# Patient Record
Sex: Female | Born: 1955 | Hispanic: Yes | Marital: Married | Smoking: Former smoker
Health system: Southern US, Community
[De-identification: ages and names within clinical notes are randomized; demographics above are authoritative.]

## PROBLEM LIST (undated history)

## (undated) DIAGNOSIS — I10 Essential (primary) hypertension: Secondary | ICD-10-CM

## (undated) DIAGNOSIS — M1990 Unspecified osteoarthritis, unspecified site: Secondary | ICD-10-CM

---

## 1999-02-09 DIAGNOSIS — E039 Hypothyroidism, unspecified: Secondary | ICD-10-CM | POA: Diagnosis present

## 1999-02-09 DIAGNOSIS — F900 Attention-deficit hyperactivity disorder, predominantly inattentive type: Secondary | ICD-10-CM | POA: Insufficient documentation

## 1999-02-09 DIAGNOSIS — I10 Essential (primary) hypertension: Secondary | ICD-10-CM | POA: Diagnosis present

## 1999-02-09 DIAGNOSIS — R7303 Prediabetes: Secondary | ICD-10-CM | POA: Insufficient documentation

## 1999-02-09 DIAGNOSIS — F339 Major depressive disorder, recurrent, unspecified: Secondary | ICD-10-CM | POA: Diagnosis present

## 1999-02-09 DIAGNOSIS — M797 Fibromyalgia: Secondary | ICD-10-CM | POA: Insufficient documentation

## 1999-02-09 DIAGNOSIS — F419 Anxiety disorder, unspecified: Secondary | ICD-10-CM | POA: Insufficient documentation

## 1999-02-09 DIAGNOSIS — M069 Rheumatoid arthritis, unspecified: Secondary | ICD-10-CM | POA: Insufficient documentation

## 2010-12-28 DIAGNOSIS — N80109 Endometriosis of ovary, unspecified side, unspecified depth: Secondary | ICD-10-CM | POA: Insufficient documentation

## 2010-12-28 DIAGNOSIS — G43909 Migraine, unspecified, not intractable, without status migrainosus: Secondary | ICD-10-CM | POA: Insufficient documentation

## 2021-01-18 ENCOUNTER — Telehealth: Payer: Self-pay | Admitting: Emergency Medicine

## 2021-01-18 NOTE — Progress Notes (Signed)
The patient no-showed for appointment despite this provider sending direct link, reaching out via phone with no response and waiting for at least 10 minutes from appointment time for patient to join. They will be marked as a NS for this appointment/time.   Dyamon Sosinski, PA-C    

## 2021-01-19 ENCOUNTER — Inpatient Hospital Stay
Admission: EM | Admit: 2021-01-19 | Discharge: 2021-01-21 | DRG: 177 | Disposition: A | Discharge status: Home / Self Care | Payer: Medicare Other | Attending: Internal Medicine | Admitting: Internal Medicine

## 2021-01-19 ENCOUNTER — Emergency Department: Payer: Medicare Other

## 2021-01-19 ENCOUNTER — Other Ambulatory Visit: Payer: Self-pay

## 2021-01-19 DIAGNOSIS — Z87891 Personal history of nicotine dependence: Secondary | ICD-10-CM | POA: Diagnosis not present

## 2021-01-19 DIAGNOSIS — J9601 Acute respiratory failure with hypoxia: Secondary | ICD-10-CM | POA: Diagnosis present

## 2021-01-19 DIAGNOSIS — J9600 Acute respiratory failure, unspecified whether with hypoxia or hypercapnia: Secondary | ICD-10-CM | POA: Diagnosis present

## 2021-01-19 DIAGNOSIS — R0902 Hypoxemia: Secondary | ICD-10-CM

## 2021-01-19 DIAGNOSIS — E871 Hypo-osmolality and hyponatremia: Secondary | ICD-10-CM | POA: Diagnosis present

## 2021-01-19 DIAGNOSIS — E861 Hypovolemia: Secondary | ICD-10-CM | POA: Diagnosis present

## 2021-01-19 DIAGNOSIS — T380X5A Adverse effect of glucocorticoids and synthetic analogues, initial encounter: Secondary | ICD-10-CM | POA: Diagnosis present

## 2021-01-19 DIAGNOSIS — U071 COVID-19: Secondary | ICD-10-CM | POA: Diagnosis present

## 2021-01-19 DIAGNOSIS — R739 Hyperglycemia, unspecified: Secondary | ICD-10-CM | POA: Diagnosis present

## 2021-01-19 LAB — BASIC METABOLIC PANEL
Anion gap: 8 (ref 5–15)
BUN: 10 mg/dL (ref 8–23)
CO2: 28 mmol/L (ref 22–32)
Calcium: 9.1 mg/dL (ref 8.9–10.3)
Chloride: 91 mmol/L — ABNORMAL LOW (ref 98–111)
Creatinine, Ser: 0.6 mg/dL (ref 0.44–1.00)
GFR, Estimated: >60 mL/min (ref >60)
Glucose, Bld: 116 mg/dL — ABNORMAL HIGH (ref 70–99)
Potassium: 3.4 mmol/L — ABNORMAL LOW (ref 3.5–5.1)
Sodium: 127 mmol/L — ABNORMAL LOW (ref 135–145)

## 2021-01-19 LAB — CBC
HCT: 39.7 % (ref 36.0–46.0)
Hemoglobin: 13.8 g/dL (ref 12.0–15.0)
MCH: 30.8 pg (ref 26.0–34.0)
MCHC: 34.8 g/dL (ref 30.0–36.0)
MCV: 88.6 fL (ref 80.0–100.0)
Platelets: 195 10*3/uL (ref 150–400)
RBC: 4.48 MIL/uL (ref 3.87–5.11)
RDW: 11.2 % — ABNORMAL LOW (ref 11.5–15.5)
WBC: 6.4 10*3/uL (ref 4.0–10.5)
nRBC: 0 % (ref 0.0–0.2)

## 2021-01-19 LAB — RESP PANEL BY RT-PCR (FLU A&B, COVID) ARPGX2
Influenza A by PCR: NEGATIVE
Influenza B by PCR: NEGATIVE
SARS Coronavirus 2 by RT PCR: POSITIVE — AB

## 2021-01-19 MED ORDER — SODIUM CHLORIDE 0.9 % IV SOLN
100.0000 mg | Freq: Every day | INTRAVENOUS | Status: DC
  Administered 2021-01-20 – 2021-01-21 (×2): 100 mg via INTRAVENOUS
  Filled 2021-01-19: qty 100
  Filled 2021-01-19: qty 20
  Filled 2021-01-19: qty 100

## 2021-01-19 MED ORDER — ONDANSETRON HCL 4 MG PO TABS: 4.0000 mg | ORAL_TABLET | Freq: Four times a day (QID) | ORAL | Status: DC | PRN

## 2021-01-19 MED ORDER — METHYLPREDNISOLONE SODIUM SUCC 125 MG IJ SOLR
0.5000 mg/kg | Freq: Two times a day (BID) | INTRAMUSCULAR | Status: DC
  Administered 2021-01-20 – 2021-01-21 (×3): 45 mg via INTRAVENOUS
  Filled 2021-01-19 (×3): qty 2

## 2021-01-19 MED ORDER — SODIUM CHLORIDE 0.9 % IV SOLN: Freq: Once | INTRAVENOUS | Status: AC

## 2021-01-19 MED ORDER — GUAIFENESIN-DM 100-10 MG/5ML PO SYRP: 10.0000 mL | ORAL_SOLUTION | ORAL | Status: DC | PRN

## 2021-01-19 MED ORDER — HYDROCOD POLST-CPM POLST ER 10-8 MG/5ML PO SUER
5.0000 mL | Freq: Two times a day (BID) | ORAL | Status: DC | PRN
  Administered 2021-01-21: 5 mL via ORAL
  Filled 2021-01-19: qty 5

## 2021-01-19 MED ORDER — PREDNISONE 20 MG PO TABS: 50.0000 mg | ORAL_TABLET | Freq: Every day | ORAL | Status: DC

## 2021-01-19 MED ORDER — ENOXAPARIN SODIUM 60 MG/0.6ML IJ SOSY
0.5000 mg/kg | PREFILLED_SYRINGE | INTRAMUSCULAR | Status: DC
  Administered 2021-01-20 – 2021-01-21 (×2): 45 mg via SUBCUTANEOUS
  Filled 2021-01-19 (×2): qty 0.6

## 2021-01-19 MED ORDER — ACETAMINOPHEN 325 MG PO TABS: 650.0000 mg | ORAL_TABLET | Freq: Four times a day (QID) | ORAL | Status: DC | PRN

## 2021-01-19 MED ORDER — DEXAMETHASONE SODIUM PHOSPHATE 10 MG/ML IJ SOLN
10.0000 mg | Freq: Once | INTRAMUSCULAR | Status: AC
  Administered 2021-01-19: 10 mg via INTRAVENOUS
  Filled 2021-01-19: qty 1

## 2021-01-19 MED ORDER — PREDNISONE 50 MG PO TABS: 50.0000 mg | ORAL_TABLET | Freq: Every day | ORAL | Status: DC

## 2021-01-19 MED ORDER — SODIUM CHLORIDE 0.9 % IV SOLN
200.0000 mg | Freq: Once | INTRAVENOUS | Status: AC
  Administered 2021-01-20: 200 mg via INTRAVENOUS
  Filled 2021-01-19: qty 200

## 2021-01-19 MED ORDER — ONDANSETRON HCL 4 MG/2ML IJ SOLN: 4.0000 mg | Freq: Four times a day (QID) | INTRAMUSCULAR | Status: DC | PRN

## 2021-01-19 MED ORDER — SODIUM CHLORIDE 0.9 % IV BOLUS
500.0000 mL | Freq: Once | INTRAVENOUS | Status: AC
  Administered 2021-01-19: 500 mL via INTRAVENOUS

## 2021-01-19 MED ORDER — METHYLPREDNISOLONE SODIUM SUCC 125 MG IJ SOLR: 0.5000 mg/kg | Freq: Two times a day (BID) | INTRAMUSCULAR | Status: DC

## 2021-01-19 NOTE — Progress Notes (Signed)
PHARMACIST - PHYSICIAN COMMUNICATION  CONCERNING:  Enoxaparin (Lovenox) for DVT Prophylaxis    RECOMMENDATION: Patient was prescribed enoxaprin 40mg  q24 hours for VTE prophylaxis.   Filed Weights   01/19/21 1333  Weight: 89.4 kg (197 lb)    Body mass index is 30.85 kg/m.  Estimated Creatinine Clearance: 78.3 mL/min (by C-G formula based on SCr of 0.6 mg/dL).   Based on [facility] policy patient is candidate for enoxaparin 0.5mg /kg TBW SQ every 24 hours based on BMI being >30.  DESCRIPTION: Pharmacy has adjusted enoxaparin dose per [facility] policy.  Patient is now receiving enoxaparin 0.5 mg/kg every 24 hours    [facility], PharmD, [facility] 01/19/2021 11:32 PM

## 2021-01-19 NOTE — H&P (Signed)
History and Physical    Kelli Pruitt YCX:[id number] DOB: 07-04-1955 DOA: 01/19/2021  PCP: No primary care provider on file.   Patient coming from: home  I have personally briefly reviewed patient's relevant medical records in [facility] Link  Chief Complaint: cough and fever  HPI: Kelli Pruitt is a 65 y.o. female with medical history significant for No significant medical history, diagnosed with COVID on 12/8 who was sent by her PCP to the ED due to persistent symptoms.  She has a cough, shortness of breath diarrhea and protracted weakness.  She denies fever or chills and denies chest pain or leg pain.  ED course: T-max 100.4, respirations 23 with O2 sat 88 to 89% on room air and otherwise normal vitals Blood work with mild hyponatremia of 127, potassium 3.4  EKG, personally viewed and interpreted: Sinus at 70 with no acute ST-T wave changes  Chest x-ray nonacute  Patient started on remdesivir and Decadron.  Hospitalist consulted for admission.   Review of Systems: As per HPI otherwise all other systems on review of systems negative.   Assessment/Plan Principal Problem:   Acute respiratory failure due to COVID-19 (HCC) -Continue remdesivir - Methylprednisolone - Supplemental oxygen - Antitussives - Airborne precautions  Hyponatremia - Likely hypovolemic - One-time bolus of NS and monitor   DVT prophylaxis: Lovenox  Code Status: full code  Family Communication:  none  Disposition Plan: Back to previous home environment Consults called: none  Status:At the time of admission, it appears that the appropriate admission status for this patient is INPATIENT. This is judged to be reasonable and necessary in order to provide the required intensity of service to ensure the patient's safety given the presenting symptoms, physical exam findings, and initial radiographic and laboratory data in the context of their  Comorbid conditions.   Patient requires inpatient status due to  high intensity of service, high risk for further deterioration and high frequency of surveillance required.   I certify that at the point of admission it is my clinical judgment that the patient will require inpatient hospital care spanning beyond 2 midnights     Physical Exam: Vitals:   01/19/21 1333 01/19/21 2124 01/19/21 2200 01/19/21 2230  BP: 131/65 138/66 (!) 112/29 (!) 126/51  Pulse: 75 78 79 68  Resp: 18 19 18  (!) 23  Temp: 99.9 F (37.7 C) (!) 100.4 F (38 C)    TempSrc: Oral Oral    SpO2: 90% 98% 96% 99%  Weight: 89.4 kg     Height: 5\' 7"  (1.702 m)      Constitutional: Alert, oriented x 3 .  Conversational dyspnea HEENT:      Head: Normocephalic and atraumatic.         Eyes: PERLA, EOMI, Conjunctivae are normal. Sclera is non-icteric.       Mouth/Throat: Mucous membranes are moist.       Neck: Supple with no signs of meningismus. Cardiovascular: Regular rate and rhythm. No murmurs, gallops, or rubs. 2+ symmetrical distal pulses are present . No JVD. No  LE edema Respiratory: Respiratory effort increased with coarse breath sounds bilaterally Gastrointestinal: Soft, non tender, non distended. Positive bowel sounds.  Genitourinary: No CVA tenderness. Musculoskeletal: Nontender with normal range of motion in all extremities. No cyanosis, or erythema of extremities. Neurologic:  Face is symmetric. Moving all extremities. No gross focal neurologic deficits . Skin: Skin is warm, dry.  No rash or ulcers Psychiatric: Mood and affect are appropriate     History reviewed.  No pertinent past medical history.  History reviewed. No pertinent surgical history.   reports that she has quit smoking. Her smoking use included cigarettes. She has never used smokeless tobacco. No history on file for alcohol use and drug use.  No Known Allergies  History reviewed. No pertinent family history.    Prior to Admission medications   Not on File      Labs on Admission: I have  personally reviewed following labs and imaging studies  CBC: Recent Labs  Lab 01/19/21 1352  WBC 6.4  HGB 13.8  HCT 39.7  MCV 88.6  PLT 195   Basic Metabolic Panel: Recent Labs  Lab 01/19/21 1352  NA 127*  K 3.4*  CL 91*  CO2 28  GLUCOSE 116*  BUN 10  CREATININE 0.60  CALCIUM 9.1   GFR: Estimated Creatinine Clearance: 78.3 mL/min (by C-G formula based on SCr of 0.6 mg/dL). Liver Function Tests: No results for input(s): AST, ALT, ALKPHOS, BILITOT, PROT, ALBUMIN in the last 168 hours. No results for input(s): LIPASE, AMYLASE in the last 168 hours. No results for input(s): AMMONIA in the last 168 hours. Coagulation Profile: No results for input(s): INR, PROTIME in the last 168 hours. Cardiac Enzymes: No results for input(s): CKTOTAL, CKMB, CKMBINDEX, TROPONINI in the last 168 hours. BNP (last 3 results) No results for input(s): PROBNP in the last 8760 hours. HbA1C: No results for input(s): HGBA1C in the last 72 hours. CBG: No results for input(s): GLUCAP in the last 168 hours. Lipid Profile: No results for input(s): CHOL, HDL, LDLCALC, TRIG, CHOLHDL, LDLDIRECT in the last 72 hours. Thyroid Function Tests: No results for input(s): TSH, T4TOTAL, FREET4, T3FREE, THYROIDAB in the last 72 hours. Anemia Panel: No results for input(s): VITAMINB12, FOLATE, FERRITIN, TIBC, IRON, RETICCTPCT in the last 72 hours. Urine analysis: No results found for: COLORURINE, APPEARANCEUR, LABSPEC, PHURINE, GLUCOSEU, HGBUR, BILIRUBINUR, KETONESUR, PROTEINUR, UROBILINOGEN, NITRITE, LEUKOCYTESUR  Radiological Exams on Admission: DG Chest 2 View  Result Date: 01/19/2021 CLINICAL DATA:  COVID positive. EXAM: CHEST - 2 VIEW COMPARISON:  None. FINDINGS: Trachea is midline. Heart size normal. Minimal streaky atelectasis or scarring in the lingula. No airspace consolidation or pleural fluid. IMPRESSION: No acute findings. Electronically Signed   By: Leanna Battles M.D.   On: 01/19/2021 12:59        Andris Baumann MD [facility]   01/19/2021, 11:09 PM

## 2021-01-19 NOTE — ED Triage Notes (Signed)
First Nurse Note:  Arrives from PCP for ED evaluation.  + COVID on 12/8... PCP concerned for pneumonia.  VS wnl.

## 2021-01-19 NOTE — Consult Note (Signed)
Remdesivir - Pharmacy Brief Note   O:  ALT: pending SpO2: 98% on 2L   A/P:  Remdesivir 200 mg IVPB once followed by 100 mg IVPB daily x 4 days.   Sharen Hones, PharmD, BCPS Clinical Pharmacist   01/19/2021 10:01 PM

## 2021-01-19 NOTE — ED Provider Notes (Signed)
[facility] Emergency Department Provider Note   ____________________________________________   I have reviewed the triage vital signs and the nursing notes.   HISTORY  Chief Complaint Cough   History limited by: Not Limited   HPI Kelli Pruitt is a 65 y.o. female who presents to the emergency department today because of concerns for shortness of breath.  Patient took a home COVID test which was positive.  She states she has had many symptoms of COVID including GI upset, muscle aches, sore throat as well as the shortness of breath.  She denies any history of liver disease.  She states that she has received both COVID shots.  Records reviewed. Recent work up for thyroid nodule.    Prior to Admission medications   Not on File    Allergies Patient has no known allergies.  History reviewed. No pertinent family history.  Social History Social History   Tobacco Use   Smoking status: Former    Types: Cigarettes   Smokeless tobacco: Never  Vaping Use   Vaping Use: Never used    Review of Systems Constitutional: Positive for fever.  Eyes: No visual changes. ENT: Positive for sore throat.  Cardiovascular: Denies chest pain. Respiratory: Positive for shortness of breath. Gastrointestinal: Positive for nausea.  Genitourinary: Negative for dysuria. Musculoskeletal: Positive for body aches.  Skin: Negative for rash. Neurological: Negative for headaches, focal weakness or numbness.  ____________________________________________   PHYSICAL EXAM:  VITAL SIGNS: ED Triage Vitals  Enc Vitals Group     BP 01/19/21 1333 131/65     Pulse Rate 01/19/21 1333 75     Resp 01/19/21 1333 18     Temp 01/19/21 1333 99.9 F (37.7 C)     Temp Source 01/19/21 1333 Oral     SpO2 01/19/21 1333 90 %     Weight 01/19/21 1333 197 lb (89.4 kg)     Height 01/19/21 1333 5\' 7"  (1.702 m)     Head Circumference --      Peak Flow --      Pain Score 01/19/21 1216 0    Constitutional: Alert and oriented.  Eyes: Conjunctivae are normal.  ENT      Head: Normocephalic and atraumatic.      Nose: No congestion/rhinnorhea.      Mouth/Throat: Mucous membranes are moist.      Neck: No stridor. Hematological/Lymphatic/Immunilogical: No cervical lymphadenopathy. Cardiovascular: Normal rate, regular rhythm.  No murmurs, rubs, or gallops.  Respiratory: Slightly increased work of breathing. Rhonchi in bases.  Gastrointestinal: Soft and non tender. No rebound. No guarding.  Genitourinary: Deferred Musculoskeletal: Normal range of motion in all extremities. No lower extremity edema. Neurologic:  Normal speech and language. No gross focal neurologic deficits are appreciated.  Skin:  Skin is warm, dry and intact. No rash noted. Psychiatric: Mood and affect are normal. Speech and behavior are normal. Patient exhibits appropriate insight and judgment.  ____________________________________________    LABS (pertinent positives/negatives)  BMP na 127, k 3.4, glu 116, cr 0.60 CBC wbc 6.4, hgb 13.8, plt 195  ____________________________________________   EKG  I, 14/12/22, attending physician, personally viewed and interpreted this EKG  EKG Time: 1345 Rate: 70 Rhythm: normal sinus rhythm Axis: normal Intervals: qtc 425 QRS: narrow ST changes: no st elevation Impression: normal ekg  ____________________________________________    RADIOLOGY  CXR No acute abnormality  ____________________________________________   PROCEDURES  Procedures  ____________________________________________   INITIAL IMPRESSION / ASSESSMENT AND PLAN / ED COURSE  Pertinent  labs & imaging results that were available during my care of the patient were reviewed by me and considered in my medical decision making (see chart for details).   Patient presented to the emergency department today because of concerns for COVID symptoms.  Patient was found to be hypoxic on  room air.  Chest x-ray without any concerning pneumonia.  Will start treatment with IV steroids and remdesivir.  Will plan on admission to the hospitalist service.  Discussed plan with patient.   ____________________________________________   FINAL CLINICAL IMPRESSION(S) / ED DIAGNOSES  Final diagnoses:  COVID-19  Hypoxia     Note: This dictation was prepared with Dragon dictation. Any transcriptional errors that result from this process are unintentional     Phineas Semen, MD 01/19/21 2232

## 2021-01-19 NOTE — ED Triage Notes (Signed)
Pt states she was dx with covid, 12/8 has had a cough, diarrhea, weakness. Pt is sating 88-89%RA , pt placed on 2L [place]

## 2021-01-20 ENCOUNTER — Encounter: Payer: Self-pay | Admitting: Internal Medicine

## 2021-01-20 LAB — COMPREHENSIVE METABOLIC PANEL
ALT: 301 U/L — ABNORMAL HIGH (ref 0–44)
AST: 205 U/L — ABNORMAL HIGH (ref 15–41)
Albumin: 3.5 g/dL (ref 3.5–5.0)
Alkaline Phosphatase: 83 U/L (ref 38–126)
Anion gap: 6 (ref 5–15)
BUN: 11 mg/dL (ref 8–23)
CO2: 29 mmol/L (ref 22–32)
Calcium: 8.9 mg/dL (ref 8.9–10.3)
Chloride: 97 mmol/L — ABNORMAL LOW (ref 98–111)
Creatinine, Ser: 0.57 mg/dL (ref 0.44–1.00)
GFR, Estimated: >60 mL/min (ref >60)
Glucose, Bld: 193 mg/dL — ABNORMAL HIGH (ref 70–99)
Potassium: 3.3 mmol/L — ABNORMAL LOW (ref 3.5–5.1)
Sodium: 132 mmol/L — ABNORMAL LOW (ref 135–145)
Total Bilirubin: 0.6 mg/dL (ref 0.3–1.2)
Total Protein: 6.6 g/dL (ref 6.5–8.1)

## 2021-01-20 LAB — CBC WITH DIFFERENTIAL/PLATELET
Abs Immature Granulocytes: 0.03 10*3/uL (ref 0.00–0.07)
Basophils Absolute: 0 10*3/uL (ref 0.0–0.1)
Basophils Relative: 0 %
Eosinophils Absolute: 0 10*3/uL (ref 0.0–0.5)
Eosinophils Relative: 0 %
HCT: 37.2 % (ref 36.0–46.0)
Hemoglobin: 13.2 g/dL (ref 12.0–15.0)
Immature Granulocytes: 1 %
Lymphocytes Relative: 8 %
Lymphs Abs: 0.5 10*3/uL — ABNORMAL LOW (ref 0.7–4.0)
MCH: 31.6 pg (ref 26.0–34.0)
MCHC: 35.5 g/dL (ref 30.0–36.0)
MCV: 89 fL (ref 80.0–100.0)
Monocytes Absolute: 0.4 10*3/uL (ref 0.1–1.0)
Monocytes Relative: 7 %
Neutro Abs: 4.8 10*3/uL (ref 1.7–7.7)
Neutrophils Relative %: 84 %
Platelets: 188 10*3/uL (ref 150–400)
RBC: 4.18 MIL/uL (ref 3.87–5.11)
RDW: 11.2 % — ABNORMAL LOW (ref 11.5–15.5)
WBC: 5.7 10*3/uL (ref 4.0–10.5)
nRBC: 0 % (ref 0.0–0.2)

## 2021-01-20 LAB — GLUCOSE, CAPILLARY
Glucose-Capillary: 168 mg/dL — ABNORMAL HIGH (ref 70–99)
Glucose-Capillary: 181 mg/dL — ABNORMAL HIGH (ref 70–99)
Glucose-Capillary: 161 mg/dL — ABNORMAL HIGH (ref 70–99)
Glucose-Capillary: 106 mg/dL — ABNORMAL HIGH (ref 70–99)

## 2021-01-20 LAB — MAGNESIUM: Magnesium: 2 mg/dL (ref 1.7–2.4)

## 2021-01-20 LAB — D-DIMER, QUANTITATIVE: D-Dimer, Quant: 1.39 ug/mL-FEU — ABNORMAL HIGH (ref 0.00–0.50)

## 2021-01-20 LAB — C-REACTIVE PROTEIN: CRP: 7.3 mg/dL — ABNORMAL HIGH (ref <1.0)

## 2021-01-20 LAB — HIV ANTIBODY (ROUTINE TESTING W REFLEX): HIV Screen 4th Generation wRfx: NONREACTIVE

## 2021-01-20 MED ORDER — INSULIN ASPART 100 UNIT/ML IJ SOLN: 0.0000 [IU] | Freq: Every day | INTRAMUSCULAR | Status: DC

## 2021-01-20 MED ORDER — TRAZODONE HCL 50 MG PO TABS
50.0000 mg | ORAL_TABLET | Freq: Every evening | ORAL | Status: DC | PRN
  Administered 2021-01-20: 50 mg via ORAL
  Filled 2021-01-20: qty 1

## 2021-01-20 MED ORDER — INSULIN ASPART 100 UNIT/ML IJ SOLN
0.0000 [IU] | Freq: Three times a day (TID) | INTRAMUSCULAR | Status: DC
  Administered 2021-01-20 (×2): 3 [IU] via SUBCUTANEOUS
  Administered 2021-01-21: 12:00:00 2 [IU] via SUBCUTANEOUS
  Administered 2021-01-21: 10:00:00 3 [IU] via SUBCUTANEOUS
  Filled 2021-01-20 (×4): qty 1

## 2021-01-20 MED ORDER — ALPRAZOLAM 0.5 MG PO TABS
0.5000 mg | ORAL_TABLET | Freq: Two times a day (BID) | ORAL | Status: DC | PRN
  Administered 2021-01-20 – 2021-01-21 (×2): 0.5 mg via ORAL
  Filled 2021-01-20 (×2): qty 1

## 2021-01-20 MED ORDER — POTASSIUM CHLORIDE CRYS ER 20 MEQ PO TBCR
40.0000 meq | EXTENDED_RELEASE_TABLET | Freq: Once | ORAL | Status: AC
  Administered 2021-01-20: 40 meq via ORAL
  Filled 2021-01-20: qty 2

## 2021-01-20 NOTE — ED Notes (Addendum)
Presents to the ED with concerns of covid like s/s. Was seen by telehealth earlier today c/o congestion of the sinuses, post nasal drip, cough and severe fatigue. She has had diarrhea and vomiting not able to keep fluids down . Symptoms started Dec 9. SOB. Congested cough. Patient tested positive for COVID. Per report from triage, sats in the 80's. Requiring O2 per [place] at 2 lpm. C/o chronic back pain. Feels dehydrated and thirsty.

## 2021-01-20 NOTE — Progress Notes (Signed)
PROGRESS NOTE    Kelli Pruitt  UYE:[id number] DOB: 1955/11/08 DOA: 01/19/2021 PCP: Olin Hauser   Brief Narrative: Taken from H&P. Kelli Pruitt is a 65 y.o. female with medical history significant for No significant medical history, diagnosed with COVID on 12/8 who was sent by her PCP to the ED due to persistent symptoms.  She has a cough, shortness of breath diarrhea and protracted weakness.  On arrival she was febrile at 100.4.  Desaturating around 88% on room air requiring up to 2 L of oxygen.  Labs with mild hyponatremia.  She was started on remdesivir and steroid.  Subjective: Patient was feeling much improved when seen today.  Saturating in high 90s on 2 L of oxygen.  No prior oxygen use.  Diarrhea has been resolved.  Assessment & Plan:   Principal Problem:   Acute respiratory failure due to COVID-19 [facility]) Active Problems:   Hyponatremia  Acute hypoxic respiratory failure secondary to COVID-19 infection.  No baseline oxygen use, currently requiring up to 2 L of oxygen.  D-dimer elevated at 1.39, CRP at 7.3.  CMP with some transaminitis. -Continue with remdesivir-day 2 -Continue with steroid -Try weaning from oxygen -Monitor CMP as patient is on remdesivir and having mild transaminitis. -Continue with supportive care and supplement  Hyponatremia.  Sodium improving. -Continue to monitor  Hyperglycemia.  No prior diagnosis of diabetes.  Most likely secondary to steroid. -Check A1c -Add SSI  Objective: Vitals:   01/20/21 0136 01/20/21 0139 01/20/21 0620 01/20/21 0732  BP: 116/77  (!) 124/58 116/64  Pulse: 78  61 63  Resp: 18  18 19   Temp: 98.6 F (37 C)   97.8 F (36.6 C)  TempSrc: Oral     SpO2: 94%  97% 97%  Weight:  87.6 kg    Height:  5\' 7"  (1.702 m)      Intake/Output Summary (Last 24 hours) at 01/20/2021 1506 Last data filed at 01/20/2021 1501 Gross per 24 hour  Intake 100 ml  Output --  Net 100 ml   Filed Weights   01/19/21 1333 01/20/21 0139   Weight: 89.4 kg 87.6 kg    Examination:  General exam: Appears calm and comfortable  Respiratory system: Clear to auscultation. Respiratory effort normal. Cardiovascular system: S1 & S2 heard, RRR. No JVD, murmurs, rubs, gallops or clicks. Gastrointestinal system: Soft, nontender, nondistended, bowel sounds positive. Central nervous system: Alert and oriented. No focal neurological deficits. Extremities: No edema, no cyanosis, pulses intact and symmetrical. Psychiatry: Judgement and insight appear normal.   DVT prophylaxis: Lovenox Code Status: Full Family Communication: Discussed with patient Disposition Plan:  Status is: Inpatient  Remains inpatient appropriate because: Severity of illness   Level of care: Med-Surg  All the records are reviewed and case discussed with Care Management/Social Worker. Management plans discussed with the patient, nursing and they are in agreement.  Consultants:  None  Procedures:  Antimicrobials:   Data Reviewed: I have personally reviewed following labs and imaging studies  CBC: Recent Labs  Lab 01/19/21 1352 01/20/21 0516  WBC 6.4 5.7  NEUTROABS  --  4.8  HGB 13.8 13.2  HCT 39.7 37.2  MCV 88.6 89.0  PLT 195 188   Basic Metabolic Panel: Recent Labs  Lab 01/19/21 1352 01/20/21 0516  NA 127* 132*  K 3.4* 3.3*  CL 91* 97*  CO2 28 29  GLUCOSE 116* 193*  BUN 10 11  CREATININE 0.60 0.57  CALCIUM 9.1 8.9  MG  --  2.0  GFR: Estimated Creatinine Clearance: 77.6 mL/min (by C-G formula based on SCr of 0.57 mg/dL). Liver Function Tests: Recent Labs  Lab 01/20/21 0516  AST 205*  ALT 301*  ALKPHOS 83  BILITOT 0.6  PROT 6.6  ALBUMIN 3.5   No results for input(s): LIPASE, AMYLASE in the last 168 hours. No results for input(s): AMMONIA in the last 168 hours. Coagulation Profile: No results for input(s): INR, PROTIME in the last 168 hours. Cardiac Enzymes: No results for input(s): CKTOTAL, CKMB, CKMBINDEX, TROPONINI in  the last 168 hours. BNP (last 3 results) No results for input(s): PROBNP in the last 8760 hours. HbA1C: No results for input(s): HGBA1C in the last 72 hours. CBG: Recent Labs  Lab 01/20/21 0811 01/20/21 1412  GLUCAP 168* 181*   Lipid Profile: No results for input(s): CHOL, HDL, LDLCALC, TRIG, CHOLHDL, LDLDIRECT in the last 72 hours. Thyroid Function Tests: No results for input(s): TSH, T4TOTAL, FREET4, T3FREE, THYROIDAB in the last 72 hours. Anemia Panel: No results for input(s): VITAMINB12, FOLATE, FERRITIN, TIBC, IRON, RETICCTPCT in the last 72 hours. Sepsis Labs: No results for input(s): PROCALCITON, LATICACIDVEN in the last 168 hours.  Recent Results (from the past 240 hour(s))  Resp Panel by RT-PCR (Flu A&B, Covid) Nasopharyngeal Swab     Status: Abnormal   Collection Time: 01/19/21  9:41 PM   Specimen: Nasopharyngeal Swab; Nasopharyngeal(NP) swabs in vial transport medium  Result Value Ref Range Status   SARS Coronavirus 2 by RT PCR POSITIVE (A) NEGATIVE Final    Comment: RESULT CALLED TO, READ BACK BY AND VERIFIED WITH: ELIZABETH [email or web address]  01/19/21 RH (NOTE) SARS-CoV-2 target nucleic acids are DETECTED.  The SARS-CoV-2 RNA is generally detectable in upper respiratory specimens during the acute phase of infection. Positive results are indicative of the presence of the identified virus, but do not rule out bacterial infection or co-infection with other pathogens not detected by the test. Clinical correlation with patient history and other diagnostic information is necessary to determine patient infection status. The expected result is Negative.  Fact Sheet for Patients: BloggerCourse.com  Fact Sheet for Healthcare Providers: SeriousBroker.it  This test is not yet approved or cleared by the [place] FDA and  has been authorized for detection and/or diagnosis of SARS-CoV-2 by FDA under an Emergency Use  Authorization (EUA).  This EUA will remain in effect (meaning this test ca n be used) for the duration of  the COVID-19 declaration under Section 564(b)(1) of the Act, 21 U.S.C. section 360bbb-3(b)(1), unless the authorization is terminated or revoked sooner.     Influenza A by PCR NEGATIVE NEGATIVE Final   Influenza B by PCR NEGATIVE NEGATIVE Final    Comment: (NOTE) The Xpert Xpress SARS-CoV-2/FLU/RSV plus assay is intended as an aid in the diagnosis of influenza from Nasopharyngeal swab specimens and should not be used as a sole basis for treatment. Nasal washings and aspirates are unacceptable for Xpert Xpress SARS-CoV-2/FLU/RSV testing.  Fact Sheet for Patients: BloggerCourse.com  Fact Sheet for Healthcare Providers: SeriousBroker.it  This test is not yet approved or cleared by the [place] FDA and has been authorized for detection and/or diagnosis of SARS-CoV-2 by FDA under an Emergency Use Authorization (EUA). This EUA will remain in effect (meaning this test can be used) for the duration of the COVID-19 declaration under Section 564(b)(1) of the Act, 21 U.S.C. section 360bbb-3(b)(1), unless the authorization is terminated or revoked.  Performed at [facility], [address], [place], [place] [address]  Radiology Studies: DG Chest 2 View  Result Date: 01/19/2021 CLINICAL DATA:  COVID positive. EXAM: CHEST - 2 VIEW COMPARISON:  None. FINDINGS: Trachea is midline. Heart size normal. Minimal streaky atelectasis or scarring in the lingula. No airspace consolidation or pleural fluid. IMPRESSION: No acute findings. Electronically Signed   By: Leanna Battles M.D.   On: 01/19/2021 12:59    Scheduled Meds:  enoxaparin (LOVENOX) injection  0.5 mg/kg Subcutaneous Q24H   insulin aspart  0-15 Units Subcutaneous TID WC   insulin aspart  0-5 Units Subcutaneous QHS   methylPREDNISolone (SOLU-MEDROL) injection   0.5 mg/kg Intravenous Q12H   Followed by   Melene Muller ON 01/23/2021] predniSONE  50 mg Oral Daily   Continuous Infusions:  remdesivir 100 mg in NS 100 mL 100 mg (01/20/21 0927)     LOS: 1 day   Time spent:  More than 50% of the time was spent in counseling/coordination of care  Arnetha Courser, MD [facility]  If 7PM-7AM, please contact night-coverage [email or web address]  01/20/2021, 3:06 PM   This record has been created using Conservation officer, historic buildings. Errors have been sought and corrected,but may not always be located. Such creation errors do not reflect on the standard of care.

## 2021-01-21 LAB — CBC WITH DIFFERENTIAL/PLATELET
Abs Immature Granulocytes: 0.03 10*3/uL (ref 0.00–0.07)
Basophils Absolute: 0 10*3/uL (ref 0.0–0.1)
Basophils Relative: 0 %
Eosinophils Absolute: 0 10*3/uL (ref 0.0–0.5)
Eosinophils Relative: 0 %
HCT: 39.8 % (ref 36.0–46.0)
Hemoglobin: 13.9 g/dL (ref 12.0–15.0)
Immature Granulocytes: 1 %
Lymphocytes Relative: 12 %
Lymphs Abs: 0.8 10*3/uL (ref 0.7–4.0)
MCH: 31 pg (ref 26.0–34.0)
MCHC: 34.9 g/dL (ref 30.0–36.0)
MCV: 88.8 fL (ref 80.0–100.0)
Monocytes Absolute: 0.3 10*3/uL (ref 0.1–1.0)
Monocytes Relative: 5 %
Neutro Abs: 5.2 10*3/uL (ref 1.7–7.7)
Neutrophils Relative %: 82 %
Platelets: 215 10*3/uL (ref 150–400)
RBC: 4.48 MIL/uL (ref 3.87–5.11)
RDW: 11.2 % — ABNORMAL LOW (ref 11.5–15.5)
WBC: 6.3 10*3/uL (ref 4.0–10.5)
nRBC: 0.3 % — ABNORMAL HIGH (ref 0.0–0.2)

## 2021-01-21 LAB — COMPREHENSIVE METABOLIC PANEL
ALT: 304 U/L — ABNORMAL HIGH (ref 0–44)
AST: 192 U/L — ABNORMAL HIGH (ref 15–41)
Albumin: 3.4 g/dL — ABNORMAL LOW (ref 3.5–5.0)
Alkaline Phosphatase: 83 U/L (ref 38–126)
Anion gap: 7 (ref 5–15)
BUN: 17 mg/dL (ref 8–23)
CO2: 29 mmol/L (ref 22–32)
Calcium: 9.3 mg/dL (ref 8.9–10.3)
Chloride: 101 mmol/L (ref 98–111)
Creatinine, Ser: 0.56 mg/dL (ref 0.44–1.00)
GFR, Estimated: >60 mL/min (ref >60)
Glucose, Bld: 235 mg/dL — ABNORMAL HIGH (ref 70–99)
Potassium: 4.1 mmol/L (ref 3.5–5.1)
Sodium: 137 mmol/L (ref 135–145)
Total Bilirubin: 0.4 mg/dL (ref 0.3–1.2)
Total Protein: 6.7 g/dL (ref 6.5–8.1)

## 2021-01-21 LAB — GLUCOSE, CAPILLARY
Glucose-Capillary: 158 mg/dL — ABNORMAL HIGH (ref 70–99)
Glucose-Capillary: 125 mg/dL — ABNORMAL HIGH (ref 70–99)

## 2021-01-21 LAB — HEMOGLOBIN A1C
Hgb A1c MFr Bld: 6 % — ABNORMAL HIGH (ref 4.8–5.6)
Mean Plasma Glucose: 126 mg/dL

## 2021-01-21 LAB — D-DIMER, QUANTITATIVE: D-Dimer, Quant: 0.81 ug/mL-FEU — ABNORMAL HIGH (ref 0.00–0.50)

## 2021-01-21 LAB — C-REACTIVE PROTEIN: CRP: 6.5 mg/dL — ABNORMAL HIGH (ref <1.0)

## 2021-01-21 MED ORDER — ATORVASTATIN CALCIUM 20 MG PO TABS
20.0000 mg | ORAL_TABLET | Freq: Every day | ORAL | Status: AC
Start: 2021-01-21 — End: ?

## 2021-01-21 MED ORDER — GUAIFENESIN-DM 100-10 MG/5ML PO SYRP: 10.0000 mL | ORAL_SOLUTION | ORAL | 0 refills | Status: AC | PRN

## 2021-01-21 MED ORDER — LEVOTHYROXINE SODIUM 50 MCG PO TABS
50.0000 ug | ORAL_TABLET | Freq: Every day | ORAL | Status: DC
  Administered 2021-01-21: 10:00:00 50 ug via ORAL
  Filled 2021-01-21: qty 1

## 2021-01-21 MED ORDER — PREDNISONE 50 MG PO TABS: ORAL_TABLET | ORAL | 0 refills | Status: DC

## 2021-01-21 NOTE — Discharge Summary (Signed)
Physician Discharge Summary  Kelli Pruitt DGL:[id number] DOB: February 08, 1956 DOA: 01/19/2021  PCP: Olin Hauser  Admit date: 01/19/2021 Discharge date: 01/21/2021  Admitted From: Home Disposition: Home  Recommendations for Outpatient Follow-up:  Follow up with PCP in 1-2 weeks Please obtain BMP/CBC in one week Please follow up on the following pending results: None  Home Health: No Equipment/Devices: None Discharge Condition: Stable CODE STATUS: Full Diet recommendation: Heart Healthy / Carb Modified   Brief/Interim Summary: Kelli Pruitt is a 65 y.o. female with medical history significant for No significant medical history, diagnosed with COVID on 12/8 who was sent by her PCP to the ED due to persistent symptoms.  She has a cough, shortness of breath diarrhea and protracted weakness.  On arrival she was febrile at 100.4.  Desaturating around 88% on room air requiring up to 2 L of oxygen.  Labs with mild hyponatremia.  She was started on remdesivir and steroid. She completed 3 days of remdesivir.  Able to wean off to room air without any difficulty.  Elevated inflammatory markers which started improving.  She was also found to have mildly elevated liver enzymes which seems improving.  She was told to hold statin for about a week and then restarted.  She was also given steroid for few more days.  Her A1c was 6 and she was having mild hyperglycemia with steroid.  She was advised to watch for diet and check blood glucose level.  She needs to follow-up with her primary care provider for further recommendations.  She was also found to have mild hyponatremia which seems improving before discharge.  Her heart rate remained in 60s without any metoprolol.  We discontinued her home dose of metoprolol and she needs a close follow-up with primary care provider who can restart if needed.  Patient was on multiple psych medications and follow-up with a clinic at [facility] and they are managing her psych  meds and trying to wean few of them.  She will continue follow-up with them so they can adjust her medications.  Patient can continue with her home medications except as advised above and follow-up with her providers.  Discharge Diagnoses:  Principal Problem:   Acute respiratory failure due to COVID-19 [facility]) Active Problems:   Hyponatremia   Discharge Instructions  Discharge Instructions     Diet - low sodium heart healthy   Complete by: As directed    Discharge instructions   Complete by: As directed    It was pleasure taking care of you. Please hold lipitor for one week. You are being given prednisone for 4 days.It can increase your blood glucose levels, watch your diet for carbohydrates. Your A1c is 6, which makes you pre diabetic. Keep yourself well hydrating, and restart your Blood pressure meds slowly. Follow up with your primary care doctor closely for further recommendations.   Increase activity slowly   Complete by: As directed       Allergies as of 01/21/2021   No Known Allergies      Medication List     STOP taking these medications    metoprolol succinate 100 MG 24 hr tablet Commonly known as: TOPROL-XL       TAKE these medications    ALPRAZolam 0.5 MG tablet Commonly known as: XANAX Take 0.5 mg by mouth 2 (two) times daily as needed.   aspirin 81 MG EC tablet Take 1 tablet by mouth daily.   atomoxetine 100 MG capsule Commonly known as: STRATTERA Take 100 mg by  mouth daily.   atorvastatin 20 MG tablet Commonly known as: LIPITOR Take 1 tablet (20 mg total) by mouth daily. Hold for 1 week What changed: additional instructions   Cholecalciferol 25 MCG (1000 UT) tablet Take 1 tablet by mouth daily.   DULoxetine 30 MG capsule Commonly known as: CYMBALTA Take 30 mg by mouth daily.   FLUoxetine 40 MG capsule Commonly known as: PROZAC Take 40 mg by mouth daily.   guaiFENesin-dextromethorphan 100-10 MG/5ML syrup Commonly known as:  ROBITUSSIN DM Take 10 mLs by mouth every 4 (four) hours as needed for cough.   hydrochlorothiazide 25 MG tablet Commonly known as: HYDRODIURIL Take 25 mg by mouth daily.   levothyroxine 50 MCG tablet Commonly known as: SYNTHROID Take 1 tablet by mouth daily.   lisinopril 5 MG tablet Commonly known as: ZESTRIL Take 5 mg by mouth daily.   Multi-Vitamin tablet Take 1 tablet by mouth daily.   polyethylene glycol powder 17 GM/SCOOP powder Commonly known as: GLYCOLAX/MIRALAX Take by mouth.   predniSONE 50 MG tablet Commonly known as: DELTASONE 1 tablet daily for 3-4 more days. Start taking on: January 23, 2021   traZODone 50 MG tablet Commonly known as: DESYREL Take 75 mg by mouth at bedtime.        No Known Allergies  Consultations: None  Procedures/Studies: DG Chest 2 View  Result Date: 01/19/2021 CLINICAL DATA:  COVID positive. EXAM: CHEST - 2 VIEW COMPARISON:  None. FINDINGS: Trachea is midline. Heart size normal. Minimal streaky atelectasis or scarring in the lingula. No airspace consolidation or pleural fluid. IMPRESSION: No acute findings. Electronically Signed   By: Leanna Battles M.D.   On: 01/19/2021 12:59    Subjective: Patient was seen and examined today.  No significant overnight event.  Seems improving and ready to go home.  We discussed about her multiple psych meds which is being managed by a clinic at [facility].  We also discussed about holding metoprolol and follow-up closely with primary care provider for further recommendations.  Discharge Exam: Vitals:   01/21/21 0845 01/21/21 1217  BP: (!) 161/97 (!) 141/78  Pulse: 66 64  Resp: 16 16  Temp: 98.7 F (37.1 C) (!) 97.5 F (36.4 C)  SpO2: 100% 97%   Vitals:   01/21/21 0028 01/21/21 0536 01/21/21 0845 01/21/21 1217  BP: 121/60 122/61 (!) 161/97 (!) 141/78  Pulse: (!) 58 61 66 64  Resp: 18 18 16 16   Temp: (!) 97.5 F (36.4 C) 97.7 F (36.5 C) 98.7 F (37.1 C) (!) 97.5 F (36.4 C)  TempSrc:   Oral  Oral  SpO2: 96% 95% 100% 97%  Weight:      Height:        General: Pt is alert, awake, not in acute distress Cardiovascular: RRR, S1/S2 +, no rubs, no gallops Respiratory: CTA bilaterally, no wheezing, no rhonchi Abdominal: Soft, NT, ND, bowel sounds + Extremities: no edema, no cyanosis   The results of significant diagnostics from this hospitalization (including imaging, microbiology, ancillary and laboratory) are listed below for reference.    Microbiology: Recent Results (from the past 240 hour(s))  Resp Panel by RT-PCR (Flu A&B, Covid) Nasopharyngeal Swab     Status: Abnormal   Collection Time: 01/19/21  9:41 PM   Specimen: Nasopharyngeal Swab; Nasopharyngeal(NP) swabs in vial transport medium  Result Value Ref Range Status   SARS Coronavirus 2 by RT PCR POSITIVE (A) NEGATIVE Final    Comment: RESULT CALLED TO, READ BACK BY AND VERIFIED WITH:  ELIZABETH VILLANUEVA@2311  01/19/21 RH (NOTE) SARS-CoV-2 target nucleic acids are DETECTED.  The SARS-CoV-2 RNA is generally detectable in upper respiratory specimens during the acute phase of infection. Positive results are indicative of the presence of the identified virus, but do not rule out bacterial infection or co-infection with other pathogens not detected by the test. Clinical correlation with patient history and other diagnostic information is necessary to determine patient infection status. The expected result is Negative.  Fact Sheet for Patients: BloggerCourse.com  Fact Sheet for Healthcare Providers: SeriousBroker.it  This test is not yet approved or cleared by the [place] FDA and  has been authorized for detection and/or diagnosis of SARS-CoV-2 by FDA under an Emergency Use Authorization (EUA).  This EUA will remain in effect (meaning this test ca n be used) for the duration of  the COVID-19 declaration under Section 564(b)(1) of the Act, 21 U.S.C.  section 360bbb-3(b)(1), unless the authorization is terminated or revoked sooner.     Influenza A by PCR NEGATIVE NEGATIVE Final   Influenza B by PCR NEGATIVE NEGATIVE Final    Comment: (NOTE) The Xpert Xpress SARS-CoV-2/FLU/RSV plus assay is intended as an aid in the diagnosis of influenza from Nasopharyngeal swab specimens and should not be used as a sole basis for treatment. Nasal washings and aspirates are unacceptable for Xpert Xpress SARS-CoV-2/FLU/RSV testing.  Fact Sheet for Patients: BloggerCourse.com  Fact Sheet for Healthcare Providers: SeriousBroker.it  This test is not yet approved or cleared by the [place] FDA and has been authorized for detection and/or diagnosis of SARS-CoV-2 by FDA under an Emergency Use Authorization (EUA). This EUA will remain in effect (meaning this test can be used) for the duration of the COVID-19 declaration under Section 564(b)(1) of the Act, 21 U.S.C. section 360bbb-3(b)(1), unless the authorization is terminated or revoked.  Performed at [facility], [address] [place], [place] [address]      Labs: BNP (last 3 results) No results for input(s): BNP in the last 8760 hours. Basic Metabolic Panel: Recent Labs  Lab 01/19/21 1352 01/20/21 0516 01/21/21 0508  NA 127* 132* 137  K 3.4* 3.3* 4.1  CL 91* 97* 101  CO2 GLUCOSE 116* 193* 235*  BUN CREATININE 0.60 0.57 0.56  CALCIUM 9.1 8.9 9.3  MG  --  2.0  --    Liver Function Tests: Recent Labs  Lab 01/20/21 0516 01/21/21 0508  AST 205* 192*  ALT 301* 304*  ALKPHOS 83 83  BILITOT 0.6 0.4  PROT 6.6 6.7  ALBUMIN 3.5 3.4*   No results for input(s): LIPASE, AMYLASE in the last 168 hours. No results for input(s): AMMONIA in the last 168 hours. CBC: Recent Labs  Lab 01/19/21 1352 01/20/21 0516 01/21/21 0508  WBC 6.4 5.7 6.3  NEUTROABS  --  4.8 5.2  HGB 13.8 13.2 13.9  HCT 39.7  37.2 39.8  MCV 88.6 89.0 88.8  PLT 195 188 215   Cardiac Enzymes: No results for input(s): CKTOTAL, CKMB, CKMBINDEX, TROPONINI in the last 168 hours. BNP: Invalid input(s): POCBNP CBG: Recent Labs  Lab 01/20/21 1412 01/20/21 1629 01/20/21 2125 01/21/21 0844 01/21/21 1209  GLUCAP 181* 161* 106* 158* 125*   D-Dimer Recent Labs    01/20/21 0516 01/21/21 0508  DDIMER 1.39* 0.81*   Hgb A1c Recent Labs    01/20/21 0516  HGBA1C 6.0*   Lipid Profile No results for input(s): CHOL, HDL, LDLCALC, TRIG, CHOLHDL, LDLDIRECT in the  last 72 hours. Thyroid function studies No results for input(s): TSH, T4TOTAL, T3FREE, THYROIDAB in the last 72 hours.  Invalid input(s): FREET3 Anemia work up No results for input(s): VITAMINB12, FOLATE, FERRITIN, TIBC, IRON, RETICCTPCT in the last 72 hours. Urinalysis No results found for: COLORURINE, APPEARANCEUR, LABSPEC, PHURINE, GLUCOSEU, HGBUR, BILIRUBINUR, KETONESUR, PROTEINUR, UROBILINOGEN, NITRITE, LEUKOCYTESUR Sepsis Labs Invalid input(s): PROCALCITONIN,  WBC,  LACTICIDVEN Microbiology Recent Results (from the past 240 hour(s))  Resp Panel by RT-PCR (Flu A&B, Covid) Nasopharyngeal Swab     Status: Abnormal   Collection Time: 01/19/21  9:41 PM   Specimen: Nasopharyngeal Swab; Nasopharyngeal(NP) swabs in vial transport medium  Result Value Ref Range Status   SARS Coronavirus 2 by RT PCR POSITIVE (A) NEGATIVE Final    Comment: RESULT CALLED TO, READ BACK BY AND VERIFIED WITH: ELIZABETH VILLANUEVA@2311  01/19/21 RH (NOTE) SARS-CoV-2 target nucleic acids are DETECTED.  The SARS-CoV-2 RNA is generally detectable in upper respiratory specimens during the acute phase of infection. Positive results are indicative of the presence of the identified virus, but do not rule out bacterial infection or co-infection with other pathogens not detected by the test. Clinical correlation with patient history and other diagnostic information is necessary to  determine patient infection status. The expected result is Negative.  Fact Sheet for Patients: BloggerCourse.com  Fact Sheet for Healthcare Providers: SeriousBroker.it  This test is not yet approved or cleared by the [place] FDA and  has been authorized for detection and/or diagnosis of SARS-CoV-2 by FDA under an Emergency Use Authorization (EUA).  This EUA will remain in effect (meaning this test ca n be used) for the duration of  the COVID-19 declaration under Section 564(b)(1) of the Act, 21 U.S.C. section 360bbb-3(b)(1), unless the authorization is terminated or revoked sooner.     Influenza A by PCR NEGATIVE NEGATIVE Final   Influenza B by PCR NEGATIVE NEGATIVE Final    Comment: (NOTE) The Xpert Xpress SARS-CoV-2/FLU/RSV plus assay is intended as an aid in the diagnosis of influenza from Nasopharyngeal swab specimens and should not be used as a sole basis for treatment. Nasal washings and aspirates are unacceptable for Xpert Xpress SARS-CoV-2/FLU/RSV testing.  Fact Sheet for Patients: BloggerCourse.com  Fact Sheet for Healthcare Providers: SeriousBroker.it  This test is not yet approved or cleared by the [place] FDA and has been authorized for detection and/or diagnosis of SARS-CoV-2 by FDA under an Emergency Use Authorization (EUA). This EUA will remain in effect (meaning this test can be used) for the duration of the COVID-19 declaration under Section 564(b)(1) of the Act, 21 U.S.C. section 360bbb-3(b)(1), unless the authorization is terminated or revoked.  Performed at [facility], [address] [place], [place] [address]     Time coordinating discharge: Over 30 minutes  SIGNED:  Arnetha Courser, MD  [facility] 01/21/2021, 12:31 PM  If 7PM-7AM, please contact night-coverage [email or web address]  This record has been created using  Conservation officer, historic buildings. Errors have been sought and corrected,but may not always be located. Such creation errors do not reflect on the standard of care.

## 2021-01-21 NOTE — Progress Notes (Signed)
Barbaraann Cao to be D/C'd Home per MD order.  Discussed prescriptions and follow up appointments with the patient. Prescriptions given to patient, medication list explained in detail. Pt verbalized understanding.  Allergies as of 01/21/2021   No Known Allergies      Medication List     STOP taking these medications    metoprolol succinate 100 MG 24 hr tablet Commonly known as: TOPROL-XL       TAKE these medications    ALPRAZolam 0.5 MG tablet Commonly known as: XANAX Take 0.5 mg by mouth 2 (two) times daily as needed.   aspirin 81 MG EC tablet Take 1 tablet by mouth daily.   atomoxetine 100 MG capsule Commonly known as: STRATTERA Take 100 mg by mouth daily.   atorvastatin 20 MG tablet Commonly known as: LIPITOR Take 1 tablet (20 mg total) by mouth daily. Hold for 1 week What changed: additional instructions   Cholecalciferol 25 MCG (1000 UT) tablet Take 1 tablet by mouth daily.   DULoxetine 30 MG capsule Commonly known as: CYMBALTA Take 30 mg by mouth daily.   FLUoxetine 40 MG capsule Commonly known as: PROZAC Take 40 mg by mouth daily.   guaiFENesin-dextromethorphan 100-10 MG/5ML syrup Commonly known as: ROBITUSSIN DM Take 10 mLs by mouth every 4 (four) hours as needed for cough.   hydrochlorothiazide 25 MG tablet Commonly known as: HYDRODIURIL Take 25 mg by mouth daily.   levothyroxine 50 MCG tablet Commonly known as: SYNTHROID Take 1 tablet by mouth daily.   lisinopril 5 MG tablet Commonly known as: ZESTRIL Take 5 mg by mouth daily.   Multi-Vitamin tablet Take 1 tablet by mouth daily.   polyethylene glycol powder 17 GM/SCOOP powder Commonly known as: GLYCOLAX/MIRALAX Take by mouth.   predniSONE 50 MG tablet Commonly known as: DELTASONE 1 tablet daily for 3-4 more days. Start taking on: January 23, 2021   traZODone 50 MG tablet Commonly known as: DESYREL Take 75 mg by mouth at bedtime.        Vitals:   01/21/21 0845 01/21/21 1217   BP: (!) 161/97 (!) 141/78  Pulse: 66 64  Resp: 16 16  Temp: 98.7 F (37.1 C) (!) 97.5 F (36.4 C)  SpO2: 100% 97%    Skin clean, dry and intact without evidence of skin break down, no evidence of skin tears noted. IV catheter discontinued intact. Site without signs and symptoms of complications. Dressing and pressure applied. Pt denies pain at this time. No complaints noted.  An After Visit Summary was printed and given to the patient. Patient escorted via WC, and D/C home via private auto.  Rigoberto Noel

## 2021-07-09 DIAGNOSIS — S46911A Strain of unspecified muscle, fascia and tendon at shoulder and upper arm level, right arm, initial encounter: Secondary | ICD-10-CM | POA: Diagnosis not present

## 2021-07-09 DIAGNOSIS — S39012A Strain of muscle, fascia and tendon of lower back, initial encounter: Secondary | ICD-10-CM | POA: Diagnosis not present

## 2021-07-09 DIAGNOSIS — W01198A Fall on same level from slipping, tripping and stumbling with subsequent striking against other object, initial encounter: Secondary | ICD-10-CM | POA: Diagnosis not present

## 2021-07-09 DIAGNOSIS — S3992XA Unspecified injury of lower back, initial encounter: Secondary | ICD-10-CM | POA: Diagnosis present

## 2021-07-09 DIAGNOSIS — Y9301 Activity, walking, marching and hiking: Secondary | ICD-10-CM | POA: Insufficient documentation

## 2021-07-09 NOTE — ED Triage Notes (Signed)
Pt presents via POV c/o fall at home on stair. Reports missed the last stair. Reports lower back pain. Also reports hitting the back of her head on the window. Denies LOC. Denies taking anticoagulation. A&O x4.

## 2021-07-10 ENCOUNTER — Emergency Department
Admission: EM | Admit: 2021-07-10 | Discharge: 2021-07-10 | Disposition: A | Discharge status: Home / Self Care | Payer: Medicare Other | Attending: Emergency Medicine | Admitting: Emergency Medicine

## 2021-07-10 ENCOUNTER — Emergency Department: Payer: Medicare Other

## 2021-07-10 DIAGNOSIS — S39012A Strain of muscle, fascia and tendon of lower back, initial encounter: Secondary | ICD-10-CM | POA: Diagnosis not present

## 2021-07-10 DIAGNOSIS — W19XXXA Unspecified fall, initial encounter: Secondary | ICD-10-CM

## 2021-07-10 DIAGNOSIS — T148XXA Other injury of unspecified body region, initial encounter: Secondary | ICD-10-CM

## 2021-07-10 HISTORY — DX: Unspecified osteoarthritis, unspecified site: M19.90

## 2021-07-10 HISTORY — DX: Essential (primary) hypertension: I10

## 2021-07-10 MED ORDER — TRAMADOL HCL 50 MG PO TABS: 50.0000 mg | ORAL_TABLET | Freq: Four times a day (QID) | ORAL | 0 refills | Status: AC | PRN

## 2021-07-10 MED ORDER — HYDROMORPHONE HCL 1 MG/ML IJ SOLN
1.0000 mg | INTRAMUSCULAR | Status: AC
Start: 2021-07-10 — End: 2021-07-10
  Administered 2021-07-10: 1 mg via INTRAVENOUS
  Filled 2021-07-10: qty 1

## 2021-07-10 MED ORDER — ONDANSETRON HCL 4 MG/2ML IJ SOLN
4.0000 mg | INTRAMUSCULAR | Status: AC
  Administered 2021-07-10: 4 mg via INTRAVENOUS
  Filled 2021-07-10: qty 2

## 2021-07-10 NOTE — Discharge Instructions (Signed)
You have been seen in the Emergency Department (ED) today for a fall.  Your work up does not show any concerning injuries.   ? ?Please follow up with your doctor regarding today's Emergency Department (ED) visit and your recent fall.   ? ?Return to the ED if you have any headache, confusion, slurred speech, weakness/numbness of any arm or leg, or any increased pain. ? ?

## 2021-07-10 NOTE — ED Provider Notes (Signed)
[facility] Provider Note    Event Date/Time   First MD Initiated Contact with Patient 07/10/21 0112     (approximate)   History   Fall   HPI  Adaja Marusak is a 66 y.o. female with extensive chronic orthopedic issues including chronic back pain and problems with her right shoulder.  She walks with a cane at baseline.   She presents tonight after mechanical fall at home.  She was walking down the stairs and thought she had another step but apparently missed 1 or had one that she was not expecting.  She slipped and fell, striking the back of her head on a ledge, but also twisting her back in the process.  She did not lose consciousness and said that actually her head and neck do not hurt.  However she is having shooting pains all throughout the middle and lower parts of her back.  She feels like she is having some tingling in her extremities as well as some nausea due to the severity of the pain.  However she has no neck pain, no chest pain, no abdominal pain, and no shortness of breath.  She takes a baby aspirin but no other blood thinners.  She had no bleeding as result of the fall.     Physical Exam   Triage Vital Signs: ED Triage Vitals  Enc Vitals Group     BP 07/09/21 2201 (!) 136/46     Pulse Rate 07/09/21 2201 65     Resp 07/09/21 2201 14     Temp 07/09/21 2201 98.4 F (36.9 C)     Temp Source 07/09/21 2201 Oral     SpO2 07/09/21 2201 96 %     Weight 07/09/21 2202 83.9 kg (185 lb)     Height 07/09/21 2202 1.702 m (5\' 7" )     Head Circumference --      Peak Flow --      Pain Score 07/09/21 2200 9     Pain Loc --      Pain Edu? --      Excl. in [place]? --     Most recent vital signs: Vitals:   07/09/21 2201 07/10/21 0200  BP: (!) 136/46 128/63  Pulse: 65 62  Resp: 14 18  Temp: 98.4 F (36.9 C)   SpO2: 96% 99%     General: Awake, appears uncomfortable but is not in severe distress. CV:  Good peripheral perfusion.   Resp:  Normal  effort.  No accessory muscle usage. Abd:  No distention.  Neuro:  Patient has good grip strength bilaterally, normal upper and lower extremity major muscle groups strength including abduction and abduction of extremities.  She is able to ambulate but does so very slowly and carefully and with the use of her cane due to the pain that it causes her in her back.  She has no reported decreased sensation in her extremities.  NIH stroke scale is 0. Other:  Patient has tenderness to palpation of the soft tissues and the spine all along the thoracic and lumbar regions down to the sacrum.  There is not a specific point that hurts more than others.  There are no visible or palpable deformities.  She has no tenderness to palpation of the cervical spine and no pain in her neck when she flexes, extends, and rotates her head and neck side to side.  She has no hematoma that I can palpate at the base of her occiput  where she says that she struck the back of her head on a ledge and she is having no headache at this time.   ED Results / Procedures / Treatments   Labs (all labs ordered are listed, but only abnormal results are displayed) Labs Reviewed - No data to display    RADIOLOGY See hospital course:  no fractures/dislocations    PROCEDURES:  Critical Care performed: No  Procedures   MEDICATIONS ORDERED IN ED: Medications  HYDROmorphone (DILAUDID) injection 1 mg (1 mg Intravenous Given 07/10/21 0155)  ondansetron (ZOFRAN) injection 4 mg (4 mg Intravenous Given 07/10/21 0155)     IMPRESSION / MDM / ASSESSMENT AND PLAN / ED COURSE  I reviewed the triage vital signs and the nursing notes.                              Differential diagnosis includes, but is not limited to, fracture, dislocation, head injury, neck injury, spinal injury.  Patient's presentation is most consistent with acute presentation with potential threat to life or bodily function.  Vital signs are stable and within normal  limits.  Patient is obviously uncomfortable.  No focal findings on exam, neither neurological nor orthopedic.  However she is having pain all throughout her thoracic and lumbar spine.  Somewhat surprisingly she is having minimal if any discomfort in her head and no pain in her neck.  I offered CT scans of the head and neck which she declined.  She also agrees with me that she thinks a fracture of her spine is unlikely, it is mostly an issue with pain.  She also does not want to have lab work done which I think is very reasonable and appropriate since it will not be helpful in this particular situation.  I ordered an IV placed and Dilaudid 1 mg IV and Zofran 4 mg IV to help with the pain and nausea she is experiencing.  I also ordered thoracic and lumbar spine films.  Anticipate plan for pain control and discharge with outpatient follow-up.  She already has a spine specialist at [facility] that she sees and with whom she can follow-up.   Clinical Course as of 07/10/21 [id number]  Ludwig Clarks Jul 10, 2021  0208 I viewed and interpreted the patient's thoracic and lumbar spine x-rays.  There is no evidence of any acute fracture nor dislocation/anterolithiasis.  The radiologist also commented on no acute abnormalities. [CF]  [id number] I reassessed the patient.  She is sitting up in bed and using her phone.  She said the pain is much better.  She is comfortable with the plan for discharge and outpatient follow-up.  She said that she has plans to establish care at the pain clinic at [facility].  She said that Ultram has worked for her in the past.  I told her I would write her a small prescription for Ultram to help in the setting of the acute on chronic pain but then she can follow-up with the pain clinic after that.  I gave my usual and customary return precautions. [CF]    Clinical Course User Index [CF] Hinda Kehr, MD     FINAL CLINICAL IMPRESSION(S) / ED DIAGNOSES   Final diagnoses:  Fall, initial encounter   Musculoskeletal strain     Rx / DC Orders   ED Discharge Orders          Ordered    traMADol (ULTRAM) 50 MG tablet  Every  6 hours PRN        07/10/21 0316             Note:  This document was prepared using Dragon voice recognition software and may include unintentional dictation errors.   Hinda Kehr, MD 07/10/21 [phone]

## 2021-07-10 NOTE — ED Notes (Signed)
E-signature pad unavailable - Pt verbalized understanding of D/C information - no additional concerns at this time.  

## 2021-07-14 ENCOUNTER — Emergency Department: Payer: Medicare Other

## 2021-07-14 ENCOUNTER — Other Ambulatory Visit: Payer: Self-pay

## 2021-07-14 ENCOUNTER — Encounter: Payer: Self-pay | Admitting: *Deleted

## 2021-07-14 ENCOUNTER — Emergency Department
Admission: EM | Admit: 2021-07-14 | Discharge: 2021-07-14 | Disposition: A | Discharge status: Home / Self Care | Payer: Medicare Other | Attending: Emergency Medicine | Admitting: Emergency Medicine

## 2021-07-14 DIAGNOSIS — S060X0D Concussion without loss of consciousness, subsequent encounter: Secondary | ICD-10-CM | POA: Insufficient documentation

## 2021-07-14 DIAGNOSIS — G8929 Other chronic pain: Secondary | ICD-10-CM | POA: Insufficient documentation

## 2021-07-14 DIAGNOSIS — W01198D Fall on same level from slipping, tripping and stumbling with subsequent striking against other object, subsequent encounter: Secondary | ICD-10-CM | POA: Diagnosis not present

## 2021-07-14 DIAGNOSIS — S0990XD Unspecified injury of head, subsequent encounter: Secondary | ICD-10-CM | POA: Diagnosis present

## 2021-07-14 DIAGNOSIS — M545 Low back pain, unspecified: Secondary | ICD-10-CM | POA: Diagnosis not present

## 2021-07-14 DIAGNOSIS — I10 Essential (primary) hypertension: Secondary | ICD-10-CM | POA: Diagnosis not present

## 2021-07-14 MED ORDER — METOCLOPRAMIDE HCL 10 MG PO TABS
10.0000 mg | ORAL_TABLET | Freq: Once | ORAL | Status: AC
  Administered 2021-07-14: 10 mg via ORAL
  Filled 2021-07-14: qty 1

## 2021-07-14 MED ORDER — KETOROLAC TROMETHAMINE 10 MG PO TABS
10.0000 mg | ORAL_TABLET | Freq: Once | ORAL | Status: AC
  Administered 2021-07-14: 10 mg via ORAL
  Filled 2021-07-14: qty 1

## 2021-07-14 NOTE — Discharge Instructions (Signed)
Your CT scan of the head does not show any bleeding or any other acute issues.  Your symptoms appear to be due to a mild concussion. Please continue to follow up with your doctor for your ongoing orthopedic concerns and to arrange physical therapy.

## 2021-07-14 NOTE — ED Notes (Signed)
Pt in CT.

## 2021-07-14 NOTE — ED Notes (Signed)
Pt requesting ultram prescription for pain, Dr Joni Fears aware and will send prescription to pharmacy. Pt made aware.

## 2021-07-14 NOTE — ED Triage Notes (Addendum)
Pt to triage via wheelchair.  Pt has a headache. Pt continues to have lower back pain    pt was seen here last week for a fall and did not have scans.  Pt had xrays.  Pt taking meds for pain without relief.   pt alert  speech clear.

## 2021-07-14 NOTE — ED Provider Notes (Signed)
[facility] Provider Note    Event Date/Time   First MD Initiated Contact with Patient 07/14/21 1607     (approximate)   History   Chief Complaint: Headache   HPI  Kelli Pruitt is a 66 y.o. female with a past history of hypertension who comes ED complaining of a headache, gradual onset, constant for the last 5 days, no aggravating or alleviating factors.  No acute focal paresthesias or motor weakness or vision changes.  No neck pain.  Pain started after recent mechanical fall where she slipped the bottom step of a staircase, hitting the back of her head.  No loss of consciousness.  She was seen in the ED at that time, did not have any head trauma findings and was denying headache so no CT imaging was obtained.  She did have x-rays of the thoracic and lumbar spine which were unremarkable.  She does report some increase in her chronic low back pain symptoms after the fall.  She is planning on having spine surgery in November.  No changes in balance or coordination.     Physical Exam   Triage Vital Signs: ED Triage Vitals  Enc Vitals Group     BP 07/14/21 1516 (!) 119/51     Pulse Rate 07/14/21 1516 80     Resp 07/14/21 1516 18     Temp 07/14/21 1516 98.5 F (36.9 C)     Temp Source 07/14/21 1516 Oral     SpO2 07/14/21 1516 98 %     Weight 07/14/21 1514 15 lb (6.804 kg)     Height 07/14/21 1514 5\' 7"  (1.702 m)     Head Circumference --      Peak Flow --      Pain Score 07/14/21 1514 9     Pain Loc --      Pain Edu? --      Excl. in GC? --     Most recent vital signs: Vitals:   07/14/21 1516 07/14/21 1640  BP: (!) 119/51 (!) 125/54  Pulse: 80 69  Resp: 18 16  Temp: 98.5 F (36.9 C)   SpO2: 98% 95%    General: Awake, no distress.  CV:  Good peripheral perfusion.  Resp:  Normal effort.  Abd:  No distention.     ED Results / Procedures / Treatments   Labs (all labs ordered are listed, but only abnormal results are displayed) Labs  Reviewed - No data to display   EKG    RADIOLOGY CT head viewed and interpreted by me, negative for intracranial hemorrhage.  Radiology report reviewed.  CT lumbar spine unremarkable  PROCEDURES:  Procedures   MEDICATIONS ORDERED IN ED: Medications  ketorolac (TORADOL) tablet 10 mg (10 mg Oral Given 07/14/21 1637)  metoCLOPramide (REGLAN) tablet 10 mg (10 mg Oral Given 07/14/21 1637)     IMPRESSION / MDM / ASSESSMENT AND PLAN / ED COURSE  I reviewed the triage vital signs and the nursing notes.                              Differential diagnosis includes, but is not limited to, subdural hematoma, intracranial mass, skull fracture, concussion  Patient's presentation is most consistent with acute complicated illness / injury requiring diagnostic workup.  Patient presents with gradual onset and persistent headache since a mechanical fall 5 days ago.  She is nontoxic, she is neurologically intact.  If CT  scan does not show subdural hematoma, I think that she is stable for continued outpatient management.  I will give her Toradol and Reglan for now for pain relief.  ----------------------------------------- 5:30 PM on 07/14/2021 ----------------------------------------- CT head negative.  Patient insist on a CT scan of her lumbar spine as well even though clinically I do not suspect any significant fracture, ligamentous injury, central cord syndrome or other surgical pathology.  She does not find x-rays adequately reassuring. Will obtain CT L spine       FINAL CLINICAL IMPRESSION(S) / ED DIAGNOSES   Final diagnoses:  Concussion without loss of consciousness, subsequent encounter     Rx / DC Orders   ED Discharge Orders     None        Note:  This document was prepared using Dragon voice recognition software and may include unintentional dictation errors.   Sharman Cheek, MD 07/14/21 2102

## 2021-07-14 NOTE — ED Notes (Signed)
Attempted to discharge pt. Pt upset she did not have CT scan of her back today. Pt had xrays done with fall last week. EDP at bedside to talk to pt.

## 2021-07-15 ENCOUNTER — Telehealth: Payer: Self-pay | Admitting: Emergency Medicine

## 2021-07-15 MED ORDER — TRAMADOL HCL 50 MG PO TABS
50.0000 mg | ORAL_TABLET | Freq: Four times a day (QID) | ORAL | 0 refills | Status: AC | PRN
Start: 2021-07-15 — End: 2021-07-18

## 2021-07-15 NOTE — Telephone Encounter (Signed)
Belatedly prescribing Tramadol for acute on chronic back pain, as per plan agreed to with pt during her ED visit.

## 2021-10-29 DIAGNOSIS — Z96611 Presence of right artificial shoulder joint: Secondary | ICD-10-CM | POA: Insufficient documentation

## 2022-02-23 DIAGNOSIS — R29818 Other symptoms and signs involving the nervous system: Secondary | ICD-10-CM | POA: Insufficient documentation

## 2023-01-05 DIAGNOSIS — M4150 Other secondary scoliosis, site unspecified: Secondary | ICD-10-CM | POA: Insufficient documentation

## 2023-01-05 DIAGNOSIS — M4712 Other spondylosis with myelopathy, cervical region: Secondary | ICD-10-CM | POA: Insufficient documentation

## 2023-01-05 DIAGNOSIS — M4326 Fusion of spine, lumbar region: Secondary | ICD-10-CM | POA: Diagnosis present

## 2023-02-21 ENCOUNTER — Emergency Department
Admission: EM | Admit: 2023-02-21 | Discharge: 2023-02-21 | Disposition: A | Discharge status: Home / Self Care | Payer: Medicare Other | Attending: Emergency Medicine | Admitting: Emergency Medicine

## 2023-02-21 ENCOUNTER — Emergency Department: Payer: Medicare Other

## 2023-02-21 ENCOUNTER — Other Ambulatory Visit: Payer: Self-pay

## 2023-02-21 DIAGNOSIS — K5903 Drug induced constipation: Secondary | ICD-10-CM | POA: Insufficient documentation

## 2023-02-21 DIAGNOSIS — T50905A Adverse effect of unspecified drugs, medicaments and biological substances, initial encounter: Secondary | ICD-10-CM | POA: Insufficient documentation

## 2023-02-21 DIAGNOSIS — M545 Low back pain, unspecified: Secondary | ICD-10-CM | POA: Diagnosis present

## 2023-02-21 DIAGNOSIS — J181 Lobar pneumonia, unspecified organism: Secondary | ICD-10-CM | POA: Diagnosis not present

## 2023-02-21 DIAGNOSIS — R1033 Periumbilical pain: Secondary | ICD-10-CM | POA: Diagnosis not present

## 2023-02-21 DIAGNOSIS — J189 Pneumonia, unspecified organism: Secondary | ICD-10-CM

## 2023-02-21 LAB — CBC WITH DIFFERENTIAL/PLATELET
Abs Immature Granulocytes: 0.15 K/uL — ABNORMAL HIGH (ref 0.00–0.07)
Basophils Absolute: 0 K/uL (ref 0.0–0.1)
Basophils Relative: 0 %
Eosinophils Absolute: 0.2 K/uL (ref 0.0–0.5)
Eosinophils Relative: 2 %
HCT: 22.1 % — ABNORMAL LOW (ref 36.0–46.0)
Hemoglobin: 7.4 g/dL — ABNORMAL LOW (ref 12.0–15.0)
Immature Granulocytes: 2 %
Lymphocytes Relative: 16 %
Lymphs Abs: 1.2 K/uL (ref 0.7–4.0)
MCH: 31.4 pg (ref 26.0–34.0)
MCHC: 33.5 g/dL (ref 30.0–36.0)
MCV: 93.6 fL (ref 80.0–100.0)
Monocytes Absolute: 0.6 K/uL (ref 0.1–1.0)
Monocytes Relative: 8 %
Neutro Abs: 5.3 K/uL (ref 1.7–7.7)
Neutrophils Relative %: 72 %
Platelets: 358 K/uL (ref 150–400)
RBC: 2.36 MIL/uL — ABNORMAL LOW (ref 3.87–5.11)
RDW: 11.9 % (ref 11.5–15.5)
WBC: 7.4 K/uL (ref 4.0–10.5)
nRBC: 0.3 % — ABNORMAL HIGH (ref 0.0–0.2)

## 2023-02-21 LAB — BASIC METABOLIC PANEL
Anion gap: 11 (ref 5–15)
BUN: 7 mg/dL — ABNORMAL LOW (ref 8–23)
CO2: 27 mmol/L (ref 22–32)
Calcium: 8.6 mg/dL — ABNORMAL LOW (ref 8.9–10.3)
Chloride: 100 mmol/L (ref 98–111)
Creatinine, Ser: 0.63 mg/dL (ref 0.44–1.00)
GFR, Estimated: >60 mL/min (ref >60)
Glucose, Bld: 134 mg/dL — ABNORMAL HIGH (ref 70–99)
Potassium: 3.6 mmol/L (ref 3.5–5.1)
Sodium: 138 mmol/L (ref 135–145)

## 2023-02-21 MED ORDER — AMOXICILLIN-POT CLAVULANATE 875-125 MG PO TABS: 1.0000 | ORAL_TABLET | Freq: Two times a day (BID) | ORAL | 0 refills | Status: AC

## 2023-02-21 MED ORDER — KETOROLAC TROMETHAMINE 15 MG/ML IJ SOLN
15.0000 mg | Freq: Once | INTRAMUSCULAR | Status: AC
  Administered 2023-02-21: 15 mg via INTRAVENOUS
  Filled 2023-02-21: qty 1

## 2023-02-21 MED ORDER — HYDROMORPHONE HCL 1 MG/ML IJ SOLN
1.0000 mg | Freq: Once | INTRAMUSCULAR | Status: AC
  Administered 2023-02-21: 1 mg via INTRAVENOUS
  Filled 2023-02-21: qty 1

## 2023-02-21 MED ORDER — HYDROMORPHONE HCL 1 MG/ML IJ SOLN
0.5000 mg | Freq: Once | INTRAMUSCULAR | Status: AC
  Administered 2023-02-21: 0.5 mg via INTRAVENOUS
  Filled 2023-02-21: qty 0.5

## 2023-02-21 MED ORDER — SENNOSIDES-DOCUSATE SODIUM 8.6-50 MG PO TABS: 1.0000 | ORAL_TABLET | Freq: Every day | ORAL | 0 refills | Status: AC

## 2023-02-21 MED ORDER — IOHEXOL 300 MG/ML  SOLN
100.0000 mL | Freq: Once | INTRAMUSCULAR | Status: AC | PRN
  Administered 2023-02-21: 100 mL via INTRAVENOUS

## 2023-02-21 NOTE — ED Notes (Signed)
 [facility] Called for transport to [facility]

## 2023-02-21 NOTE — ED Notes (Signed)
Pt c/o 10/10 pain. MD made aware

## 2023-02-21 NOTE — ED Provider Notes (Signed)
 [facility] Provider Note    Event Date/Time   First MD Initiated Contact with Patient 02/21/23 0700     (approximate)   History   Back Pain   HPI Kelli Pruitt is a 68 y.o. female with recent history of T10-pelvis posterior instrumentation and fusion with L3-L5 laminectomy on 02/14/2023 presenting today for low back pain.  Patient states being discharged from the hospital 3 days ago and has had difficulty with back pain symptoms since then.  She also reports that she had had drainage from the site but the drainage has resolved.  She notes having chills at the SNF as well but no true fevers.  Also having new periumbilical abdominal pain that radiates to her back.  States that she feels a stiffness along the right side of her back.  Denies any new falls.  Denies any new numbness or weakness to bilateral lower extremities.  No saddle anesthesia.  No loss of bowel or bladder.  Separately denies nausea, vomiting, constipation.  Reviewed chart notes from recent admission to [facility] for recent lumbar surgery.     Physical Exam   Triage Vital Signs: ED Triage Vitals [02/21/23 0612]  Encounter Vitals Group     BP 139/80     Systolic BP Percentile      Diastolic BP Percentile      Pulse Rate 80     Resp 16     Temp 99.1 F (37.3 C)     Temp Source Oral     SpO2 99 %     Weight 206 lb (93.4 kg)     Height 5' 7 (1.702 m)     Head Circumference      Peak Flow      Pain Score 9     Pain Loc      Pain Education      Exclude from Growth Chart     Most recent vital signs: Vitals:   02/21/23 0612 02/21/23 1002  BP: 139/80 121/61  Pulse: 80 89  Resp: 16 20  Temp: 99.1 F (37.3 C)   SpO2: 99% 93%   I have reviewed the vital signs. General:  Awake, alert, no acute distress. Head:  Normocephalic, Atraumatic. EENT:  PERRL, EOMI, Oral mucosa pink and moist, Neck is supple. Cardiovascular: Regular rate, 2+ distal pulses. Respiratory:  Normal  respiratory effort, symmetrical expansion, no distress.   Abdomen: Mild tenderness periumbilically to the abdomen, otherwise soft and nondistended Extremities:  Moving all four extremities through full ROM without pain.  5 out of 5 strength intact to bilateral lower extremities throughout hip flexion/extension, knee extension, ankle dorsiflexion/plantarflexion Neuro:  Alert and oriented.  Interacting appropriately.  Sensation equal and intact throughout bilateral lower extremities.  No saddle anesthesia. Skin:  Warm, dry, no rash.  Midline lumbar surgical scar well-healing with minimal drainage and staples intact.  No surrounding erythema or pustular drainage present Psych: Appropriate affect.    ED Results / Procedures / Treatments   Labs (all labs ordered are listed, but only abnormal results are displayed) Labs Reviewed  CBC WITH DIFFERENTIAL/PLATELET - Abnormal; Notable for the following components:      Result Value   RBC 2.36 (*)    Hemoglobin 7.4 (*)    HCT 22.1 (*)    nRBC 0.3 (*)    Abs Immature Granulocytes 0.15 (*)    All other components within normal limits  BASIC METABOLIC PANEL - Abnormal; Notable for the following components:  Glucose, Bld 134 (*)    BUN 7 (*)    Calcium  8.6 (*)    All other components within normal limits     EKG    RADIOLOGY Independently interpreted CT abdomen/pelvis and L-spine with no acute postsurgical problems.  Independently interpreted CT chest with concern for left-sided pneumonia   PROCEDURES:  Critical Care performed: No  Procedures   MEDICATIONS ORDERED IN ED: Medications  ketorolac  (TORADOL ) 15 MG/ML injection 15 mg (15 mg Intravenous Given 02/21/23 0814)  HYDROmorphone  (DILAUDID ) injection 0.5 mg (0.5 mg Intravenous Given 02/21/23 0814)  HYDROmorphone  (DILAUDID ) injection 1 mg (1 mg Intravenous Given 02/21/23 0958)  iohexol  (OMNIPAQUE ) 300 MG/ML solution 100 mL (100 mLs Intravenous Contrast Given 02/21/23 1038)   HYDROmorphone  (DILAUDID ) injection 0.5 mg (0.5 mg Intravenous Given 02/21/23 1236)     IMPRESSION / MDM / ASSESSMENT AND PLAN / ED COURSE  I reviewed the triage vital signs and the nursing notes.                              Differential diagnosis includes, but is not limited to, postoperative infection, postoperative pain, postoperative complication  Patient's presentation is most consistent with acute complicated illness / injury requiring diagnostic workup.  Patient is a 68 year old female presenting today for low back pain in the setting of recent lumbar fusion/laminectomy.  Vital signs stable on arrival.  Strength and sensation intact to bilateral lower extremities with no acute changes from her baseline.  No other saddle anesthesia or urinary/fecal incontinence to suspect cauda equina.  Well-healing surgical site without signs of infection on the skin with no erythema or pustular drainage.  Will get CT imaging for further evaluation of possible postoperative complication as well as CT imaging of the abdomen/pelvis given new abdominal complaints.  Laboratory workup with no leukocytosis.  Hemoglobin slightly diminished at 7.4 from most recent labs.  No other signs of bleeding and suspect this is likely postsurgical.  She is not short of breath at this time.  BMP otherwise normal.  CT abdomen/pelvis with no acute intra-abdominal pathology other than constipation.  CT L-spine with no acute postsurgical changes obvious at this time.  Separately they did note a spot on her left lower lung and CT chest was ordered for further evaluation.  This shows concern for possible pneumonia.  She is otherwise stable and we will discharge her with antibiotics as well as bowel regimen.  Patient was agreeable with plan and told to follow-up regarding her hemoglobin and with her neurosurgery team.  The patient is on the cardiac monitor to evaluate for evidence of arrhythmia and/or significant heart rate  changes. Clinical Course as of 02/21/23 1306  Mon Feb 21, 2023  0826 Hemoglobin(!): 7.4 Was 8.5 five days ago [DW]    Clinical Course User Index [DW] Malvina Alm DASEN, MD     FINAL CLINICAL IMPRESSION(S) / ED DIAGNOSES   Final diagnoses:  Pneumonia of left lower lobe due to infectious organism  Drug-induced constipation     Rx / DC Orders   ED Discharge Orders          Ordered    senna-docusate (SENOKOT-S) 8.6-50 MG tablet  Daily        02/21/23 1243    amoxicillin -clavulanate (AUGMENTIN ) 875-125 MG tablet  2 times daily        02/21/23 1243             Note:  This  document was prepared using Conservation officer, historic buildings and may include unintentional dictation errors.   Malvina Alm DASEN, MD 02/21/23 [phone]

## 2023-02-21 NOTE — ED Triage Notes (Signed)
 BIBA from [facility], had spinal fusion on 1/6 at [place]. Pt c/o LBP that started this morning. Pt taking dilaudid PO and is still having breakthrough pain.  Pt also reports drainage from surgical site.

## 2023-02-21 NOTE — Discharge Instructions (Signed)
 Please take antibiotics as prescribed to treat your pneumonia.  I will also send you with medication to help continue having regular bowel movements.  You can also have them add on ibuprofen  600 mg every 8 hours for the next 5 days to your pain regimen.  Please follow-up with your neurosurgery team.  Separately your hemoglobin looking at your blood count is slightly low today but this is not unusual after surgery.  You should have them recheck this in a week to make sure it does not continue to drop.

## 2023-02-22 ENCOUNTER — Emergency Department: Payer: Medicare Other

## 2023-02-22 ENCOUNTER — Observation Stay
Admission: EM | Admit: 2023-02-22 | Discharge: 2023-02-22 | Disposition: A | Discharge status: Other Acute Inpatient Hospital | Payer: Medicare Other | Attending: Internal Medicine | Admitting: Internal Medicine

## 2023-02-22 ENCOUNTER — Other Ambulatory Visit: Payer: Self-pay

## 2023-02-22 DIAGNOSIS — U071 COVID-19: Principal | ICD-10-CM | POA: Insufficient documentation

## 2023-02-22 DIAGNOSIS — E039 Hypothyroidism, unspecified: Secondary | ICD-10-CM | POA: Insufficient documentation

## 2023-02-22 DIAGNOSIS — Z87891 Personal history of nicotine dependence: Secondary | ICD-10-CM | POA: Diagnosis not present

## 2023-02-22 DIAGNOSIS — M545 Low back pain, unspecified: Secondary | ICD-10-CM

## 2023-02-22 DIAGNOSIS — J1282 Pneumonia due to coronavirus disease 2019: Secondary | ICD-10-CM | POA: Diagnosis not present

## 2023-02-22 DIAGNOSIS — Z7982 Long term (current) use of aspirin: Secondary | ICD-10-CM | POA: Insufficient documentation

## 2023-02-22 DIAGNOSIS — Z79899 Other long term (current) drug therapy: Secondary | ICD-10-CM | POA: Insufficient documentation

## 2023-02-22 DIAGNOSIS — I10 Essential (primary) hypertension: Secondary | ICD-10-CM | POA: Diagnosis not present

## 2023-02-22 DIAGNOSIS — M4326 Fusion of spine, lumbar region: Secondary | ICD-10-CM | POA: Diagnosis not present

## 2023-02-22 DIAGNOSIS — D649 Anemia, unspecified: Secondary | ICD-10-CM

## 2023-02-22 DIAGNOSIS — Z9889 Other specified postprocedural states: Principal | ICD-10-CM

## 2023-02-22 DIAGNOSIS — D62 Acute posthemorrhagic anemia: Secondary | ICD-10-CM | POA: Diagnosis not present

## 2023-02-22 DIAGNOSIS — R0902 Hypoxemia: Secondary | ICD-10-CM | POA: Diagnosis not present

## 2023-02-22 DIAGNOSIS — J189 Pneumonia, unspecified organism: Secondary | ICD-10-CM

## 2023-02-22 DIAGNOSIS — F339 Major depressive disorder, recurrent, unspecified: Secondary | ICD-10-CM | POA: Diagnosis present

## 2023-02-22 LAB — URINALYSIS, W/ REFLEX TO CULTURE (INFECTION SUSPECTED)
Bacteria, UA: NONE SEEN
Bilirubin Urine: NEGATIVE
Glucose, UA: NEGATIVE mg/dL
Hgb urine dipstick: NEGATIVE
Ketones, ur: NEGATIVE mg/dL
Leukocytes,Ua: NEGATIVE
Nitrite: NEGATIVE
Protein, ur: NEGATIVE mg/dL
RBC / HPF: 0 RBC/hpf (ref 0–5)
Specific Gravity, Urine: 1.005 (ref 1.005–1.030)
pH: 7 (ref 5.0–8.0)

## 2023-02-22 LAB — CBC WITH DIFFERENTIAL/PLATELET
Abs Immature Granulocytes: 0.08 10*3/uL — ABNORMAL HIGH (ref 0.00–0.07)
Basophils Absolute: 0 10*3/uL (ref 0.0–0.1)
Basophils Relative: 0 %
Eosinophils Absolute: 0.1 10*3/uL (ref 0.0–0.5)
Eosinophils Relative: 2 %
HCT: 22.3 % — ABNORMAL LOW (ref 36.0–46.0)
Hemoglobin: 7.4 g/dL — ABNORMAL LOW (ref 12.0–15.0)
Immature Granulocytes: 1 %
Lymphocytes Relative: 13 %
Lymphs Abs: 1.1 10*3/uL (ref 0.7–4.0)
MCH: 31 pg (ref 26.0–34.0)
MCHC: 33.2 g/dL (ref 30.0–36.0)
MCV: 93.3 fL (ref 80.0–100.0)
Monocytes Absolute: 0.5 10*3/uL (ref 0.1–1.0)
Monocytes Relative: 6 %
Neutro Abs: 6.2 10*3/uL (ref 1.7–7.7)
Neutrophils Relative %: 78 %
Platelets: 346 10*3/uL (ref 150–400)
RBC: 2.39 MIL/uL — ABNORMAL LOW (ref 3.87–5.11)
RDW: 11.9 % (ref 11.5–15.5)
WBC: 8.1 10*3/uL (ref 4.0–10.5)
nRBC: 0 % (ref 0.0–0.2)

## 2023-02-22 LAB — COMPREHENSIVE METABOLIC PANEL
ALT: 20 U/L (ref 0–44)
AST: 33 U/L (ref 15–41)
Albumin: 3 g/dL — ABNORMAL LOW (ref 3.5–5.0)
Alkaline Phosphatase: 102 U/L (ref 38–126)
Anion gap: 12 (ref 5–15)
BUN: 8 mg/dL (ref 8–23)
CO2: 26 mmol/L (ref 22–32)
Calcium: 8.6 mg/dL — ABNORMAL LOW (ref 8.9–10.3)
Chloride: 103 mmol/L (ref 98–111)
Creatinine, Ser: 0.69 mg/dL (ref 0.44–1.00)
GFR, Estimated: >60 mL/min (ref >60)
Glucose, Bld: 113 mg/dL — ABNORMAL HIGH (ref 70–99)
Potassium: 4.3 mmol/L (ref 3.5–5.1)
Sodium: 141 mmol/L (ref 135–145)
Total Bilirubin: 0.8 mg/dL (ref 0.0–1.2)
Total Protein: 5.6 g/dL — ABNORMAL LOW (ref 6.5–8.1)

## 2023-02-22 LAB — BLOOD GAS, VENOUS
Acid-Base Excess: 5.4 mmol/L — ABNORMAL HIGH (ref 0.0–2.0)
Bicarbonate: 29.9 mmol/L — ABNORMAL HIGH (ref 20.0–28.0)
O2 Saturation: 78.9 %
Patient temperature: 37
pCO2, Ven: 42 mm[Hg] — ABNORMAL LOW (ref 44–60)
pH, Ven: 7.46 — ABNORMAL HIGH (ref 7.25–7.43)
pO2, Ven: 44 mm[Hg] (ref 32–45)

## 2023-02-22 LAB — RESP PANEL BY RT-PCR (RSV, FLU A&B, COVID)  RVPGX2
Influenza A by PCR: NEGATIVE
Influenza B by PCR: NEGATIVE
Resp Syncytial Virus by PCR: NEGATIVE
SARS Coronavirus 2 by RT PCR: POSITIVE — AB

## 2023-02-22 LAB — PROTIME-INR
INR: 1 (ref 0.8–1.2)
Prothrombin Time: 12.9 s (ref 11.4–15.2)

## 2023-02-22 LAB — LACTIC ACID, PLASMA: Lactic Acid, Venous: 1.7 mmol/L (ref 0.5–1.9)

## 2023-02-22 MED ORDER — SENNOSIDES-DOCUSATE SODIUM 8.6-50 MG PO TABS: 1.0000 | ORAL_TABLET | Freq: Every day | ORAL | Status: DC

## 2023-02-22 MED ORDER — DEXAMETHASONE SODIUM PHOSPHATE 10 MG/ML IJ SOLN
6.0000 mg | Freq: Once | INTRAMUSCULAR | Status: AC
  Administered 2023-02-22: 6 mg via INTRAVENOUS
  Filled 2023-02-22: qty 1

## 2023-02-22 MED ORDER — OXYCODONE HCL 5 MG PO TABS: 5.0000 mg | ORAL_TABLET | Freq: Four times a day (QID) | ORAL | Status: DC | PRN

## 2023-02-22 MED ORDER — LACTATED RINGERS IV BOLUS (SEPSIS)
1000.0000 mL | Freq: Once | INTRAVENOUS | Status: AC
  Administered 2023-02-22: 1000 mL via INTRAVENOUS

## 2023-02-22 MED ORDER — IBUPROFEN 800 MG PO TABS
800.0000 mg | ORAL_TABLET | Freq: Once | ORAL | Status: AC
  Administered 2023-02-22: 800 mg via ORAL
  Filled 2023-02-22: qty 1

## 2023-02-22 MED ORDER — SODIUM CHLORIDE 0.9% FLUSH
3.0000 mL | Freq: Two times a day (BID) | INTRAVENOUS | Status: DC
Start: 2023-02-22 — End: 2023-02-22
  Administered 2023-02-22: 3 mL via INTRAVENOUS

## 2023-02-22 MED ORDER — POLYSACCHARIDE IRON COMPLEX 150 MG PO CAPS
150.0000 mg | ORAL_CAPSULE | Freq: Every day | ORAL | Status: DC
  Filled 2023-02-22: qty 1

## 2023-02-22 MED ORDER — DEXAMETHASONE SODIUM PHOSPHATE 10 MG/ML IJ SOLN
6.0000 mg | INTRAMUSCULAR | Status: DC
Start: 2023-02-23 — End: 2023-02-22

## 2023-02-22 MED ORDER — POLYETHYLENE GLYCOL 3350 17 G PO PACK: 17.0000 g | PACK | Freq: Every day | ORAL | Status: DC

## 2023-02-22 MED ORDER — HYDROMORPHONE HCL 1 MG/ML IJ SOLN
1.0000 mg | Freq: Once | INTRAMUSCULAR | Status: AC
  Administered 2023-02-22: 1 mg via INTRAVENOUS
  Filled 2023-02-22: qty 1

## 2023-02-22 MED ORDER — HYDROMORPHONE HCL 1 MG/ML IJ SOLN
1.0000 mg | INTRAMUSCULAR | Status: DC | PRN
  Administered 2023-02-22: 1 mg via INTRAVENOUS
  Filled 2023-02-22: qty 1

## 2023-02-22 MED ORDER — SODIUM CHLORIDE 0.9 % IV SOLN
1.0000 g | Freq: Once | INTRAVENOUS | Status: AC
  Administered 2023-02-22: 1 g via INTRAVENOUS
  Filled 2023-02-22: qty 10

## 2023-02-22 MED ORDER — IOHEXOL 350 MG/ML SOLN
75.0000 mL | Freq: Once | INTRAVENOUS | Status: AC | PRN
  Administered 2023-02-22: 75 mL via INTRAVENOUS

## 2023-02-22 MED ORDER — ACETAMINOPHEN 500 MG PO TABS
1000.0000 mg | ORAL_TABLET | Freq: Three times a day (TID) | ORAL | Status: DC
  Administered 2023-02-22: 1000 mg via ORAL
  Filled 2023-02-22: qty 2

## 2023-02-22 MED ORDER — DOXYCYCLINE HYCLATE 100 MG PO TABS
100.0000 mg | ORAL_TABLET | Freq: Once | ORAL | Status: AC
Start: 2023-02-22 — End: 2023-02-22
  Administered 2023-02-22: 100 mg via ORAL
  Filled 2023-02-22: qty 1

## 2023-02-22 MED ORDER — KETOROLAC TROMETHAMINE 15 MG/ML IJ SOLN
15.0000 mg | Freq: Four times a day (QID) | INTRAMUSCULAR | Status: DC
  Administered 2023-02-22: 15 mg via INTRAVENOUS
  Filled 2023-02-22: qty 1

## 2023-02-22 NOTE — ED Notes (Signed)
 Called [facility] spoke with rep. Jacki Cones, faxed face sheet, power shared images, and transferred to Dr. Jodie Echevaria for further consult.

## 2023-02-22 NOTE — ED Notes (Signed)
 Called [facility] spoke with rep. Danielle. Rep stated they put the pt. on the list , but the could not provide an eta.

## 2023-02-22 NOTE — Sepsis Progress Note (Signed)
 First blood culture drawn before antibiotics hung

## 2023-02-22 NOTE — ED Notes (Signed)
 Pt assisted to BR--stand-by assist only; reports some SHOB with exertion; noted pulse ox 90% on ra; placed on o2 at 2l/min via 

## 2023-02-22 NOTE — Sepsis Progress Note (Signed)
 Elink monitoring for the code sepsis protocol.

## 2023-02-22 NOTE — ED Notes (Signed)
 ABD pads placed over back incision

## 2023-02-22 NOTE — ED Provider Notes (Addendum)
 [facility] Provider Note    Event Date/Time   First MD Initiated Contact with Patient 02/22/23 0901     (approximate)   History   Back Pain   HPI Kelli Pruitt is a 68 y.o. 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  Patient was evaluated in the emergency department yesterday for back pain, reported some drainage from her surgical site and some chills at her SNF.  Per independent chart review she had a CT of the chest, lumbar spine, abdomen pelvis done that showed a pneumonia, postoperative changes without complicating features to the ventral abdominal wall, no adverse hardware features to her surgical site.  Patient was pain controlled and sent home on antibiotics but when she returned to her SNF she reports that she was only given her Tylenol  and Dilaudid  and nothing else.  Returns today because she feels that she was not getting all her medications and her antibiotics and is therefore having difficulty getting her back pain under control.  On independent chart review of the paperwork that she came with from [facility], patient is supposed to be on Fioricet, Dilaudid  2 mg, MiraLAX , senna.  Patient states that she has not gotten all her medications at her SNF.     Physical Exam   Triage Vital Signs: ED Triage Vitals  Encounter Vitals Group     BP 02/22/23 0603 137/63     Systolic BP Percentile --      Diastolic BP Percentile --      Pulse Rate 02/22/23 0603 99     Resp 02/22/23 0603 20     Temp 02/22/23 0603 99.6 F (37.6 C)     Temp Source 02/22/23 0603 Oral     SpO2 02/22/23 0603 95 %     Weight 02/22/23 0556 206 lb (93.4 kg)     Height 02/22/23 0556 5' 7 (1.702 m)     Head Circumference --      Peak Flow --      Pain Score 02/22/23 0556 10     Pain Loc --      Pain Education --       Exclude from Growth Chart --     Most recent vital signs: Vitals:   02/22/23 1336 02/22/23 1748  BP: (!) 121/47 (!) 102/54  Pulse: 89 85  Resp: 20 20  Temp:  98.8 F (37.1 C)  SpO2: 100% 99%    General: Awake, no distress.  CV:  Good peripheral perfusion.  Mildly tachycardic Resp:  Normal effort.  Hypoxic to 88 on room air Abd:  No distention.  Nontender Other:  Surgical site to her back is intact, staples are in place, there is small amount of sanguinous drainage from the mid surgical site, there is no purulent drainage, no surrounding erythema, fluctuance, swelling.   ED Results / Procedures / Treatments   Labs (all labs ordered are listed, but only abnormal results are displayed) Labs Reviewed  RESP PANEL BY RT-PCR (RSV, FLU A&B, COVID)  RVPGX2 - Abnormal; Notable for the following components:      Result Value   SARS Coronavirus 2 by RT PCR POSITIVE (*)    All other components within normal limits  CBC WITH DIFFERENTIAL/PLATELET - Abnormal; Notable for the following components:   RBC 2.39 (*)    Hemoglobin 7.4 (*)    HCT 22.3 (*)  Abs Immature Granulocytes 0.08 (*)    All other components within normal limits  COMPREHENSIVE METABOLIC PANEL - Abnormal; Notable for the following components:   Glucose, Bld 113 (*)    Calcium  8.6 (*)    Total Protein 5.6 (*)    Albumin 3.0 (*)    All other components within normal limits  BLOOD GAS, VENOUS - Abnormal; Notable for the following components:   pH, Ven 7.46 (*)    pCO2, Ven 42 (*)    Bicarbonate 29.9 (*)    Acid-Base Excess 5.4 (*)    All other components within normal limits  URINALYSIS, W/ REFLEX TO CULTURE (INFECTION SUSPECTED) - Abnormal; Notable for the following components:   Color, Urine STRAW (*)    APPearance CLEAR (*)    All other components within normal limits  CULTURE, BLOOD (ROUTINE X 2)  CULTURE, BLOOD (ROUTINE X 2)  PROTIME-INR  LACTIC ACID, PLASMA     RADIOLOGY CT imaging on my interpretation  showed no obvious PE.  Radiology interpretation of CT angio showed no PE, redemonstration of pneumonia.   PROCEDURES:  Critical Care performed: Yes, see critical care procedure note(s)  .Critical Care  Performed by: Waymond Lorelle Cummins, MD Authorized by: Waymond Lorelle Cummins, MD   Critical care provider statement:    Critical care time (minutes):  45   Critical care time was exclusive of:  Separately billable procedures and treating other patients   Critical care was necessary to treat or prevent imminent or life-threatening deterioration of the following conditions:  Respiratory failure   Critical care was time spent personally by me on the following activities:  Development of treatment plan with patient or surrogate, discussions with consultants, evaluation of patient's response to treatment, examination of patient, ordering and review of laboratory studies, ordering and review of radiographic studies, ordering and performing treatments and interventions, pulse oximetry, re-evaluation of patient's condition and review of old charts    MEDICATIONS ORDERED IN ED: Medications  ibuprofen  (ADVIL ) tablet 800 mg (800 mg Oral Given 02/22/23 0607)  HYDROmorphone  (DILAUDID ) injection 1 mg (1 mg Intravenous Given 02/22/23 1017)  doxycycline  (VIBRA -TABS) tablet 100 mg (100 mg Oral Given 02/22/23 1020)  cefTRIAXone  (ROCEPHIN ) 1 g in sodium chloride  0.9 % 100 mL IVPB (0 g Intravenous Stopped 02/22/23 1057)  lactated ringers  bolus 1,000 mL (0 mLs Intravenous Stopped 02/22/23 1747)  iohexol  (OMNIPAQUE ) 350 MG/ML injection 75 mL (75 mLs Intravenous Contrast Given 02/22/23 1234)  dexamethasone  (DECADRON ) injection 6 mg (6 mg Intravenous Given 02/22/23 1334)  HYDROmorphone  (DILAUDID ) injection 1 mg (1 mg Intravenous Given 02/22/23 1332)     IMPRESSION / MDM / ASSESSMENT AND PLAN / ED COURSE  I reviewed the triage vital signs and the nursing notes.                              Differential diagnosis includes, but is  not limited to, pneumonia, viral illness, sepsis, PE, volume overload, ACS although less likely given that she does not chest pain, electrolyte derangements.  For her back pain doubt acute infection at this time, had a CT done yesterday that did not show any obvious issues with the hardware, she has no purulent drainage, staples to her surgical site and intact and does not show any dehiscence.  Patient with history of back surgery on 6 January of this year presenting with hypoxia in the setting of a pneumonia, also found to have COVID-pneumonia, given  a dose of Decadron  here.  Received a dose of ceftriaxone  and Doxy prior to COVID results.  Given her hypoxia and COVID pneumonia, patient is at high risk and we will have her admitted.  Consult to hospitalist was placed and she is agreeable to see the patient.  Patient's presentation is most consistent with acute presentation with potential threat to life or bodily function.   Clinical Course as of 02/23/23 0745  Tue Feb 22, 2023  9046 Patient satting 88-90 on room air, she is placed on oxygen. [TT]  1250 Urinalysis, w/ Reflex to Culture (Infection Suspected) -Urine, Clean Catch(!) Negative for UTI [TT]  1252 Resp panel by RT-PCR (RSV, Flu A&B, Covid) Anterior Nasal Swab(!) COVID-positive, given that she is hypoxic, will give her 6 mg of Decadron . [TT]  1313 Consulted hospitalist by phone who will evaluate the patient. [TT]  1521 Received a call from hospitalist who consulted neurosurgery, neurosurgery states that any drainage from the wound at this time is unusual, recommended transfer to [facility] to be evaluated and managed by her original surgeons.  Shared decision making done with patient and she is agreeable to be transferred.  Will contact [facility] to discuss transfer. [TT]  1541 Spoke to [facility] transfer center and they will have Ortho call me back. [TT]  1608 WBC: 8.1 No leukocytosis [TT]  1608 Comprehensive metabolic panel(!) Electrolytes not severely  deranged.  Creatinine is normal. [TT]  1612 Signout patient to the oncoming ED physician pending callback from [facility] orthopedic surgery about transfer. [TT]    Clinical Course User Index [TT] Waymond Lorelle Cummins, MD     FINAL CLINICAL IMPRESSION(S) / ED DIAGNOSES   Final diagnoses:  S/P laminectomy  Low back pain without sciatica, unspecified back pain laterality, unspecified chronicity  Pneumonia of right lung due to infectious organism, unspecified part of lung  COVID  Hypoxia     Rx / DC Orders   ED Discharge Orders     None        Note:  This document was prepared using Dragon voice recognition software and may include unintentional dictation errors.    Waymond Lorelle Cummins, MD 02/22/23 1609    Waymond Lorelle Cummins, MD 02/23/23 [phone]

## 2023-02-22 NOTE — ED Notes (Signed)
 Spoke to rep. Jacki Cones from [place] [facility], pt. has been excepted ED to ED [address] [place] [address]. Accepting Dr. Greig Castilla Number. [phone]. Calling [employer] [facility] for transport.

## 2023-02-22 NOTE — ED Triage Notes (Signed)
 Pt to ED via EMS from [facility], pt had spinal fusion on 1/6 at [facility], pt was seen at [facility] pain clinic prior to surgeries. Pt is taking dilaudid  at [facility], pt c/o constipation and feels like this is making her back pain worse. Pt states she has had CT scans that showed her constipation. Pt states she was dx with PNA yesterday here.

## 2023-02-22 NOTE — ED Provider Notes (Signed)
-----------------------------------------   4:53 PM on 02/22/2023 ----------------------------------------- Patient care assumed from prior provider.  I spoke to [facility], they have accepted the patient to their service under Dr. Rod of orthopedics.  We will arrange transfer ED to ED.  Patient agreeable to plan of care.   Dorothyann Drivers, MD 02/22/23 [phone]

## 2023-02-22 NOTE — Assessment & Plan Note (Signed)
 Patient presenting with 3 days of fever and shortness of breath on exertion noted to have evidence of pneumonia on CT imaging and PCR positive for COVID #19.  - Continuous pulse oximetry - Continue supplemental oxygen to maintain oxygen saturation above 88% - Wean as tolerated - Decadron  6 mg IV daily till discharge - Supportive management with guaifenesin  as needed

## 2023-02-22 NOTE — Assessment & Plan Note (Signed)
 Prior to recent surgery, patient's hemoglobin was noted to be within normal range at 13.4, down to 8.7 postop.  Currently 7.4, however stable over the last 24 hours.  Will monitor very closely given use of Toradol  for pain control.  - Repeat CBC in the a.m. - Transfuse for hemoglobin less than 7 - Start iron  supplementation

## 2023-02-22 NOTE — Discharge Summary (Signed)
 Physician Discharge Summary   Patient: Kelli Pruitt MRN: [id number] DOB: 1955-06-01  Admit date:     02/22/2023  Discharge date: 02/22/23  Discharge Physician: Clayborne Broom   PCP: Treasa Pin, MD   Recommendations at discharge:    Please consult Neurosurgery on admission Continue Decadron  daily while requiring oxygen for COVID-19 pneumonia  Discharge Diagnoses: Principal Problem:   Pneumonia due to COVID-19 virus Active Problems:   Fusion of spine of lumbar region   Postoperative anemia   Hypothyroid   Hypertension, essential   Recurrent major depression (HCC)  Resolved Problems:   * No resolved hospital problems. [facility] Course:  Kelli Pruitt states that since being discharged from [facility] after her recent spinal surgery, she has been experiencing excruciating back pain.  She notes that at her facility, she has not received any of her scheduled pain medications and only receives Dilaudid  intermittently.  Then over the last 3 days, she has been experiencing fever and shortness of breath on exertion.  She denies any cough but feels as though she may be developing congestion now.  Due to the symptoms, she came to the ED yesterday.  After she was discharged, the facility did not administer any of her medications so she requested to come back today.   She endorses serosanguineous drainage from her incision and significant pain, especially from the superior portion.  ED course: On arrival to the ED, patient was normotensive at 137/63 with heart rate of 99.  She was initially saturating at 95% before desaturating at down to 88% on room air while resting.  Due to this, she is placed on 2 L.  Initial workup notable for hemoglobin of 7.4, glucose 113, creat 0.69 with GFR above 60.  Lactic acid within normal limits.  Urinalysis negative.  COVID-19 PCR positive.  CTA was obtained with evidence of focal opacity in the left lower lung but no evidence of PE.  Patient started on Decadron ,  Dilaudid , IV fluids.  [facility] contacted for admission.  Hospital Course:  Discussed with neurosurgery, who noted serosanguineous drainage from incision is abnormal will require evaluation by primary surgical team.  Due to this, discussed with the ED physician, who will help with arranging transfer.  [facility] transfer accepted and discharge orders placed.  Assessment and Plan:  * Pneumonia due to COVID-19 virus Patient presenting with 3 days of fever and shortness of breath on exertion noted to have evidence of pneumonia on CT imaging and PCR positive for COVID #19.  - Continuous pulse oximetry - Continue supplemental oxygen to maintain oxygen saturation above 88% - Wean as tolerated - Decadron  6 mg IV daily till discharge - Supportive management with guaifenesin  as needed  Fusion of spine of lumbar region Patient recently underwent pelvic to T10 laminectomy and fusion on 1/6 at [facility] and was discharged on 1/8.  Unfortunately, she has been having difficulty receiving her medications at [facility] and is experiencing significant pain.  CT yesterday unremarkable however serosanguinous drainage noted.  Discussed with neurosurgery here at [facility], who notes drainage is not expected at this stage.  Will discuss with the EDP to see if transfer to [facility] arranged, and if not, will request neurosurgery evaluate patient here.  - Neurosurgery consulted; appreciate their recommendations - Pain control with Tylenol , Oxycodone , Dilaudid  and Toradol    Postoperative anemia Prior to recent surgery, patient's hemoglobin was noted to be within normal range at 13.4, down to 8.7 postop.  Currently 7.4, however stable over the last 24 hours.  Will monitor very closely given use of Toradol  for pain control.  - Repeat CBC in the a.m. - Transfuse for hemoglobin less than 7 - Start iron  supplementation  Recurrent major depression (HCC) - Continue home regimen  Hypertension, essential - Continue home  antihypertensives  Hypothyroid - Continue home Synthroid    Consultants: Neurosurgery Procedures performed: None  Disposition:  [facility] Diet recommendation:  Regular diet  DISCHARGE MEDICATION: Allergies as of 02/22/2023       Reactions   Amlodipine    Glimepiride         Medication List     TAKE these medications    ALPRAZolam  0.5 MG tablet Commonly known as: XANAX  Take 0.5 mg by mouth 2 (two) times daily as needed.   amoxicillin -clavulanate 875-125 MG tablet Commonly known as: AUGMENTIN  Take 1 tablet by mouth 2 (two) times daily for 5 days.   aspirin EC 81 MG tablet Take 1 tablet by mouth daily.   atomoxetine 100 MG capsule Commonly known as: STRATTERA Take 100 mg by mouth daily.   atorvastatin  20 MG tablet Commonly known as: LIPITOR Take 1 tablet (20 mg total) by mouth daily. Hold for 1 week   Cholecalciferol 25 MCG (1000 UT) tablet Take 1 tablet by mouth daily.   DULoxetine 30 MG capsule Commonly known as: CYMBALTA Take 30 mg by mouth daily.   FLUoxetine 40 MG capsule Commonly known as: PROZAC Take 40 mg by mouth daily.   guaiFENesin -dextromethorphan 100-10 MG/5ML syrup Commonly known as: ROBITUSSIN DM Take 10 mLs by mouth every 4 (four) hours as needed for cough.   hydrochlorothiazide 25 MG tablet Commonly known as: HYDRODIURIL Take 25 mg by mouth daily.   levothyroxine  50 MCG tablet Commonly known as: SYNTHROID  Take 1 tablet by mouth daily.   lisinopril 5 MG tablet Commonly known as: ZESTRIL Take 5 mg by mouth daily.   Multi-Vitamin tablet Take 1 tablet by mouth daily.   polyethylene glycol powder 17 GM/SCOOP powder Commonly known as: GLYCOLAX /MIRALAX  Take by mouth.   predniSONE  50 MG tablet Commonly known as: DELTASONE  1 tablet daily for 3-4 more days.   senna-docusate 8.6-50 MG tablet Commonly known as: Senokot-S Take 1 tablet by mouth daily.   traZODone  50 MG tablet Commonly known as: DESYREL  Take 75 mg by mouth at  bedtime.        Condition at discharge: stable  The results of significant diagnostics from this hospitalization (including imaging, microbiology, ancillary and laboratory) are listed below for reference.   Imaging Studies: CT Angio Chest PE W/Cm &/Or Wo Cm Result Date: 02/22/2023 CLINICAL DATA:  Hypoxia.  Shortness of breath. EXAM: CT ANGIOGRAPHY CHEST WITH CONTRAST TECHNIQUE: Multidetector CT imaging of the chest was performed using the standard protocol during bolus administration of intravenous contrast. Multiplanar CT image reconstructions and MIPs were obtained to evaluate the vascular anatomy. RADIATION DOSE REDUCTION: This exam was performed according to the departmental dose-optimization program which includes automated exposure control, adjustment of the mA and/or kV according to patient size and/or use of iterative reconstruction technique. CONTRAST:  75mL OMNIPAQUE  IOHEXOL  350 MG/ML SOLN COMPARISON:  CT scan chest from 02/21/2023. FINDINGS: Cardiovascular: No evidence of embolism to the proximal subsegmental pulmonary artery level. Normal cardiac size. No pericardial effusion. No aortic aneurysm. Mediastinum/Nodes: Visualized thyroid gland appears grossly unremarkable. No solid / cystic mediastinal masses. The esophagus is nondistended precluding optimal assessment. No axillary, mediastinal or hilar lymphadenopathy by size criteria. Lungs/Pleura: The central tracheo-bronchial tree is patent. Redemonstration of focal airspace  opacity in the left lung lower lobe, favored to represent pneumonia. There is trace left pleural effusion. There are additional patchy areas of linear, plate-like atelectasis and/or scarring throughout bilateral lungs. No suspicious mass or pneumothorax. No right pleural effusion. No suspicious lung nodules. Upper Abdomen: Surgically absent gallbladder. Remaining visualized upper abdominal viscera within normal limits. Musculoskeletal: The visualized soft tissues of the  chest wall are grossly unremarkable. No suspicious osseous lesions. Redemonstration of lower thoracic/upper lumbar spinal fixation hardware. There are overlying skin staples. Review of the MIP images confirms the above findings. IMPRESSION: 1. No embolism to the proximal subsegmental pulmonary artery level. 2. Redemonstration of focal airspace opacity in the left lung lower lobe, favored to represent pneumonia. 3. Multiple other nonacute observations, as described above. Electronically Signed   By: Ree Molt M.D.   On: 02/22/2023 13:39   CT Chest Wo Contrast Result Date: 02/21/2023 CLINICAL DATA:  68 year old female with abdominal pain radiating to the back. Postoperative day 7 from spinal surgery. Partially visible superior segment left lower lobe lung opacity on CT Abdomen and Pelvis today. EXAM: CT CHEST WITHOUT CONTRAST TECHNIQUE: Multidetector CT imaging of the chest was performed following the standard protocol without IV contrast. RADIATION DOSE REDUCTION: This exam was performed according to the departmental dose-optimization program which includes automated exposure control, adjustment of the mA and/or kV according to patient size and/or use of iterative reconstruction technique. COMPARISON:  CT Abdomen and Pelvis 1043 hours today. FINDINGS: Cardiovascular: Heart size within normal limits. No pericardial effusion. Calcified aortic atherosclerosis. Vascular patency is not evaluated in the absence of IV contrast. Mediastinum/Nodes: No mediastinal mass or lymphadenopathy. Lungs/Pleura: Combined solid and sub solid peribronchial opacity in the superior segment of the left lower lobe with small adjacent pleural effusion or less likely pleural thickening (series 2, image 74, coronal image 115). This has an infectious/inflammatory appearance. Trace retained secretions in the trachea above the carina. The major airways are patent, including to the superior segment. Mild dependent lung atelectasis otherwise,  including along the left major fissure. Upper Abdomen: Stable visible upper abdomen, contrast being excreted to the left renal collecting system now. Musculoskeletal: Right shoulder arthroplasty with streak artifact, in addition to lower thoracic through lumbar spine postoperative changes detailed earlier today. IMPRESSION: 1. Abnormal opacity most compatible with Bronchopneumonia. Adjacent trace pleural fluid or pleural thickening. Consider aspiration in this clinical setting, and mild retained secretions are visible in the trachea. 2. Stable thoracic spine and upper abdomen to the CTs reported separately today. Electronically Signed   By: VEAR Hurst M.D.   On: 02/21/2023 12:34   CT L-SPINE NO CHARGE Result Date: 02/21/2023 CLINICAL DATA:  68 year old female with abdominal pain radiating to the back. Postoperative day 7 from spinal surgery. EXAM: CT LUMBAR SPINE WITH CONTRAST TECHNIQUE: Technique: Multiplanar CT images of the lumbar spine were reconstructed from contemporary CT of the Abdomen and Pelvis. RADIATION DOSE REDUCTION: This exam was performed according to the departmental dose-optimization program which includes automated exposure control, adjustment of the mA and/or kV according to patient size and/or use of iterative reconstruction technique. CONTRAST:  No additional COMPARISON:  CT Abdomen and Pelvis today reported separately. Preoperative lumbar spine CT 07/14/2021. FINDINGS: Segmentation: Transitional anatomy as described previously. Assuming the lowest ribs are T12 results in for lumbarized vertebrae, thus sacralized L5 level is designated as on the 2023 comparison. Correlation with radiographs is recommended prior to any operative intervention. Alignment: Not significantly changed from 2023. Mild chronic levoconvex lower lumbar scoliosis. Stable  lordosis. Vertebrae: Spinal fusion sequelae beginning at the T10 level and continuing to the pelvis, details are below. Underlying chronic osteopenia.  Chronic vertebral endplate Schmorl's nodes. Stable vertebral body height since 2023. No unexpected osseous changes are identified. Visible SI joints appear to remain intact. Paraspinal and other soft tissues: Abdomen and pelvis are detailed separately today. Postoperative changes to the posterior paraspinal soft tissues with a small volume of posterior paraspinal fluid which is probably hematoma/seroma. Midline posterior skin incision with skin staples in place. No drainable fluid collection is evident by CT. Disc levels: No spinal hardware or postoperative changes on the previous exam. T8-T9: Negative. T9-T10: Negative. T10-T11: Bilateral pedicle screws with no adverse features identified. Posterior connecting rods begin at this level. T11-T12: Bilateral pedicle screws with no adverse features. T12-L1: Bilateral pedicle screws with no adverse features. Partial spinous process resections. There is a 3rd internal posterior spinal rod beginning at the T12 level. L1-L2: Bilateral pedicle screws with no adverse features. Partial spinous process resections. L2-L3: Posterior and interbody fusion hardware here. Posterior decompression. Left lateral endplate spurring at this level. And there seems to be evidence of interbody arthrodesis there on series 5, image 59 coronal image today. L3-L4: Posterior and interbody fusion hardware and posterior decompression. Left far lateral endplate osteophyte incidentally noted. No obvious arthrodesis here. No adverse hardware features. L4-L5: Posterior and anterior interbody fusion hardware. Partial laminectomy here. Evidence of right anterior interbody solid arthrodesis on series 5, image 56. No adverse hardware features. L5-S1: Sacralized. L5 and bilateral sacroiliac screws. No adverse hardware features identified. IMPRESSION: 1. Transitional lumbosacral anatomy, with lumbarized L5 level designated as on a 2023 lumbar CT. Correlation with radiographs is recommended prior to any  operative intervention. 2. Sequelae of recent spinal fusion from T10 through the sacrum. No adverse hardware features identified. Underlying generalized osteopenia. No unexpected osseous changes. And some levels (L2-L3 and L4-L5) may have been previously fused and demonstrates some evidence of interbody arthrodesis already. 3. Postoperative changes to the posterior paraspinal soft tissues with no unexpected features. 4. CT Abdomen and Pelvis today reported separately. Electronically Signed   By: VEAR Hurst M.D.   On: 02/21/2023 11:35   CT ABDOMEN PELVIS W CONTRAST Result Date: 02/21/2023 CLINICAL DATA:  68 year old female with abdominal pain radiating to the back. Postoperative day 7 from spinal surgery. EXAM: CT ABDOMEN AND PELVIS WITH CONTRAST TECHNIQUE: Multidetector CT imaging of the abdomen and pelvis was performed using the standard protocol following bolus administration of intravenous contrast. RADIATION DOSE REDUCTION: This exam was performed according to the departmental dose-optimization program which includes automated exposure control, adjustment of the mA and/or kV according to patient size and/or use of iterative reconstruction technique. CONTRAST:  OMNIPAQUE  IOHEXOL  300 MG/ML  SOLN COMPARISON:  Lumbar spine CT today reported separately. FINDINGS: Lower chest: Cardiac size is within normal limits. No pericardial effusion. Trace layering left pleural fluid. But otherwise relatively good lung base ventilation. However, on the most cephalad image there is patchy and nodular opacity partially visible in the superior segment of the left lower lobe (12 mm nodular density there series 4, image 1). Hepatobiliary: Cholecystectomy.  Liver is within normal limits. Pancreas: Partially atrophied. Spleen: Negative. Adrenals/Urinary Tract: Normal adrenal glands. Nonobstructed kidneys with symmetric renal enhancement and appropriate contrast excretion. Small left renal midpole cortical cyst appears benign (no  follow-up imaging recommended). No hydroureter. Unremarkable urinary bladder. Incidental pelvic phleboliths. Stomach/Bowel: Moderate large bowel redundancy, especially the transverse colon. Mild to moderate retained stool throughout. Normal  appendix tracking lateral and retrocecal on series 2, image 61. No dilated small bowel. Postoperative changes to the ventral abdominal wall with subcutaneous stranding, curvilinear incision and/or granulation. No abdominal wall hernia. No soft tissue gas or fluid collection. Decompressed stomach and duodenum. No free air or free fluid. Vascular/Lymphatic: Aortoiliac calcified atherosclerosis. Normal caliber abdominal aorta. Major arterial structures in the abdomen and pelvis appear patent. Portal venous system appears to be patent. No lymphadenopathy. Reproductive: Negative. Other: No pelvis free fluid. Musculoskeletal: Spine is detailed separately today. Visible lower ribs appear intact. Pelvis and proximal femurs appear intact. IMPRESSION: 1. Partially visible abnormal opacity in the superior segment of the left lower lobe, partially nodular. This is nonspecific but query bronchopneumonia. Trace pleural fluid. Dedicated Chest CT recommended, noncontrast may suffice. 2. The spine is detailed on dedicated CT separately. 3. Postoperative changes also to the ventral abdominal wall, no complicating features. 4. Redundant large bowel with retained stool. No evidence of bowel obstruction or inflammation. Normal appendix. 5.  Aortic Atherosclerosis (ICD10-I70.0). Electronically Signed   By: VEAR Hurst M.D.   On: 02/21/2023 11:13    Microbiology: Results for orders placed or performed during the hospital encounter of 02/22/23  Resp panel by RT-PCR (RSV, Flu A&B, Covid) Anterior Nasal Swab     Status: Abnormal   Collection Time: 02/22/23 10:26 AM   Specimen: Anterior Nasal Swab  Result Value Ref Range Status   SARS Coronavirus 2 by RT PCR POSITIVE (A) NEGATIVE Final    Comment:  (NOTE) SARS-CoV-2 target nucleic acids are DETECTED.  The SARS-CoV-2 RNA is generally detectable in upper respiratory specimens during the acute phase of infection. Positive results are indicative of the presence of the identified virus, but do not rule out bacterial infection or co-infection with other pathogens not detected by the test. Clinical correlation with patient history and other diagnostic information is necessary to determine patient infection status. The expected result is Negative.  Fact Sheet for Patients: bloggercourse.com  Fact Sheet for Healthcare Providers: seriousbroker.it  This test is not yet approved or cleared by the [place]  FDA and  has been authorized for detection and/or diagnosis of SARS-CoV-2 by FDA under an Emergency Use Authorization (EUA).  This EUA will remain in effect (meaning this test can be used) for the duration of  the COVID-19 declaration under Section 564(b)(1) of the A ct, 21 U.S.C. section 360bbb-3(b)(1), unless the authorization is terminated or revoked sooner.     Influenza A by PCR NEGATIVE NEGATIVE Final   Influenza B by PCR NEGATIVE NEGATIVE Final    Comment: (NOTE) The Xpert Xpress SARS-CoV-2/FLU/RSV plus assay is intended as an aid in the diagnosis of influenza from Nasopharyngeal swab specimens and should not be used as a sole basis for treatment. Nasal washings and aspirates are unacceptable for Xpert Xpress SARS-CoV-2/FLU/RSV testing.  Fact Sheet for Patients: bloggercourse.com  Fact Sheet for Healthcare Providers: seriousbroker.it  This test is not yet approved or cleared by the [place]  FDA and has been authorized for detection and/or diagnosis of SARS-CoV-2 by FDA under an Emergency Use Authorization (EUA). This EUA will remain in effect (meaning this test can be used) for the duration of the COVID-19  declaration under Section 564(b)(1) of the Act, 21 U.S.C. section 360bbb-3(b)(1), unless the authorization is terminated or revoked.     Resp Syncytial Virus by PCR NEGATIVE NEGATIVE Final    Comment: (NOTE) Fact Sheet for Patients: bloggercourse.com  Fact Sheet for Healthcare Providers: seriousbroker.it  This test is  not yet approved or cleared by the [place]  FDA and has been authorized for detection and/or diagnosis of SARS-CoV-2 by FDA under an Emergency Use Authorization (EUA). This EUA will remain in effect (meaning this test can be used) for the duration of the COVID-19 declaration under Section 564(b)(1) of the Act, 21 U.S.C. section 360bbb-3(b)(1), unless the authorization is terminated or revoked.  Performed at [facility], [address] [place], [place] [address]     Labs: CBC: Recent Labs  Lab 02/21/23 0815 02/22/23 1025  WBC 7.4 8.1  NEUTROABS 5.3 6.2  HGB 7.4* 7.4*  HCT 22.1* 22.3*  MCV 93.6 93.3  PLT 358 346   Basic Metabolic Panel: Recent Labs  Lab 02/21/23 0815 02/22/23 1025  NA 138 141  K 3.6 4.3  CL 100 103  CO2 27 26  GLUCOSE 134* 113*  BUN 7* 8  CREATININE 0.63 0.69  CALCIUM  8.6* 8.6*   Liver Function Tests: Recent Labs  Lab 02/22/23 1025  AST 33  ALT 20  ALKPHOS 102  BILITOT 0.8  PROT 5.6*  ALBUMIN 3.0*   CBG: No results for input(s): GLUCAP in the last 168 hours.  Discharge time spent: less than 30 minutes.  Signed: Clayborne Broom, MD [facility] 02/22/2023

## 2023-02-22 NOTE — Progress Notes (Signed)
 CODE SEPSIS - PHARMACY COMMUNICATION  **Broad Spectrum Antibiotics should be administered within 1 hour of Sepsis diagnosis**  Time Code Sepsis Called/Page Received: 1/14 @ 0952 AM  Antibiotics Ordered:  Doxycycline  100 x 1 Ceftriaxone  1g x 1  Time of 1st antibiotic administration: 1/14 @ 1020  Additional action taken by pharmacy: NA  If necessary, Name of Provider/Nurse Contacted: NA  Alfonso MARLA Buys, PharmD Pharmacy Resident  02/22/2023 9:58 AM

## 2023-02-22 NOTE — Assessment & Plan Note (Signed)
 -  Continue home regimen

## 2023-02-22 NOTE — Sepsis Progress Note (Signed)
 Blood cultures charted as drawn @ 1025, antibiotics hung @ 1020. Current nurse states unsure if blood cultures drawn before antibiotics hung

## 2023-02-22 NOTE — H&P (Addendum)
 History and Physical    Patient: Kelli Pruitt FMW:[id number] DOB: November 20, 1955 DOA: 02/22/2023 DOS: the patient was seen and examined on 02/22/2023 PCP: Treasa Pin, MD  Patient coming from: Home  Chief Complaint:  Chief Complaint  Patient presents with   Back Pain   HPI: Kelli Pruitt is a 68 y.o. female with medical history significant of Hypothyroidism, prediabetes, rheumatoid arthritis, chronic pain syndrome on chronic opioids, hypertension, who presents to the ED due to back pain.  Mr. Solorio states that since being discharged from [facility] after her recent spinal surgery, she has been experiencing excruciating back pain.  She notes that at her facility, she has not received any of her scheduled pain medications and only receives Dilaudid  intermittently.  Then over the last 3 days, she has been experiencing fever and shortness of breath on exertion.  She denies any cough but feels as though she may be developing congestion now.  Due to the symptoms, she came to the ED yesterday.  After she was discharged, the facility did not administer any of her medications so she requested to come back today.  She endorses serosanguineous drainage from her incision and significant pain, especially from the superior portion.  ED course: On arrival to the ED, patient was normotensive at 137/63 with heart rate of 99.  She was initially saturating at 95% before desaturating at down to 88% on room air while resting.  Due to this, she is placed on 2 L.  Initial workup notable for hemoglobin of 7.4, glucose 113, creat 0.69 with GFR above 60.  Lactic acid within normal limits.  Urinalysis negative.  COVID-19 PCR positive.  CTA was obtained with evidence of focal opacity in the left lower lung but no evidence of PE.  Patient started on Decadron , Dilaudid , IV fluids.  TRH contacted for admission.   Review of Systems: As mentioned in the history of present illness. All other systems reviewed and are  negative.  Past Medical History:  Diagnosis Date   Arthritis    Hypertension    History reviewed. No pertinent surgical history.  Social History:  reports that she has quit smoking. Her smoking use included cigarettes. She has never used smokeless tobacco. She reports current alcohol use. No history on file for drug use.  Allergies  Allergen Reactions   Amlodipine    Glimepiride     History reviewed. No pertinent family history.  Prior to Admission medications   Medication Sig Start Date End Date Taking? Authorizing Provider  ALPRAZolam  (XANAX ) 0.5 MG tablet Take 0.5 mg by mouth 2 (two) times daily as needed. 12/30/20   [provider]  amoxicillin -clavulanate (AUGMENTIN ) 875-125 MG tablet Take 1 tablet by mouth 2 (two) times daily for 5 days. 02/21/23 02/26/23  Malvina Alm DASEN, MD  aspirin 81 MG EC tablet Take 1 tablet by mouth daily. 09/01/05   [provider]  atomoxetine (STRATTERA) 100 MG capsule Take 100 mg by mouth daily. 12/01/20   [provider]  atorvastatin  (LIPITOR) 20 MG tablet Take 1 tablet (20 mg total) by mouth daily. Hold for 1 week 01/21/21   Amin, Sumayya, MD  Cholecalciferol 25 MCG (1000 UT) tablet Take 1 tablet by mouth daily.    [provider]  DULoxetine (CYMBALTA) 30 MG capsule Take 30 mg by mouth daily. 01/13/21   [provider]  FLUoxetine (PROZAC) 40 MG capsule Take 40 mg by mouth daily. 11/23/20   [provider]  guaiFENesin -dextromethorphan (ROBITUSSIN DM) 100-10 MG/5ML syrup Take  10 mLs by mouth every 4 (four) hours as needed for cough. 01/21/21   Amin, Sumayya, MD  hydrochlorothiazide (HYDRODIURIL) 25 MG tablet Take 25 mg by mouth daily. 11/17/20   [provider]  levothyroxine  (SYNTHROID ) 50 MCG tablet Take 1 tablet by mouth daily. 11/12/20 11/12/21  [provider]  lisinopril (ZESTRIL) 5 MG tablet Take 5 mg by mouth daily. 12/24/20   [provider]  Multiple Vitamin  (MULTI-VITAMIN) tablet Take 1 tablet by mouth daily.    [provider]  polyethylene glycol powder (GLYCOLAX /MIRALAX ) 17 GM/SCOOP powder Take by mouth. Patient not taking: Reported on 01/20/2021 02/07/20   [provider]  predniSONE  (DELTASONE ) 50 MG tablet 1 tablet daily for 3-4 more days. 01/23/21   Amin, Sumayya, MD  senna-docusate (SENOKOT-S) 8.6-50 MG tablet Take 1 tablet by mouth daily. 02/21/23   Malvina Alm DASEN, MD  traZODone  (DESYREL ) 50 MG tablet Take 75 mg by mouth at bedtime. 12/24/20   [provider]    Physical Exam: Vitals:   02/22/23 0603 02/22/23 1000 02/22/23 1055 02/22/23 1336  BP: 137/63  (!) 148/50 (!) 121/47  Pulse: 99  76 89  Resp: 20 (!) 24  20  Temp: 99.6 F (37.6 C)     TempSrc: Oral     SpO2: 95% (!) 88%  100%  Weight:      Height:       Physical Exam Vitals and nursing note reviewed.  Constitutional:      General: She is not in acute distress.    Appearance: She is normal weight.  HENT:     Head: Normocephalic and atraumatic.     Mouth/Throat:     Mouth: Mucous membranes are moist.     Pharynx: Oropharynx is clear.  Eyes:     Conjunctiva/sclera: Conjunctivae normal.     Pupils: Pupils are equal, round, and reactive to light.  Cardiovascular:     Rate and Rhythm: Normal rate and regular rhythm.     Heart sounds: No murmur heard.    No gallop.  Pulmonary:     Effort: Pulmonary effort is normal. No respiratory distress.     Breath sounds: Normal breath sounds. No wheezing, rhonchi or rales.  Abdominal:     General: Bowel sounds are normal. There is no distension.     Palpations: Abdomen is soft.     Tenderness: There is abdominal tenderness (diffusely). There is no guarding.  Musculoskeletal:     Right lower leg: No edema.     Left lower leg: No edema.  Skin:    General: Skin is warm and dry.     Comments: Large spinal incision noted, no evidence of dehiscence.  Minimal erythema just adjacent to the incision itself  and no evidence of purulent drainage however patient's gown has notable serosanguineous drainage.   Neurological:     General: No focal deficit present.     Mental Status: She is alert and oriented to person, place, and time. Mental status is at baseline.  Psychiatric:        Mood and Affect: Mood normal.        Behavior: Behavior normal.    Data Reviewed: CBC with WBC of 8.1, hemoglobin of 7.4, MCV of 93.3, platelets of 346 BMP with sodium of 141, potassium 4.3, bicarb 26, glucose 111, BUN 8, creatinine 0.69, albumin 3.0, AST 33, ALT 20, GFR above 60 Lactic acid 1.7 COVID-19 PCR positive.  COVID influenza and RSV PCR negative. Urinalysis  with no abnormalities  CT Angio Chest PE W/Cm &/Or Wo Cm Result Date: 02/22/2023 CLINICAL DATA:  Hypoxia.  Shortness of breath. EXAM: CT ANGIOGRAPHY CHEST WITH CONTRAST TECHNIQUE: Multidetector CT imaging of the chest was performed using the standard protocol during bolus administration of intravenous contrast. Multiplanar CT image reconstructions and MIPs were obtained to evaluate the vascular anatomy. RADIATION DOSE REDUCTION: This exam was performed according to the departmental dose-optimization program which includes automated exposure control, adjustment of the mA and/or kV according to patient size and/or use of iterative reconstruction technique. CONTRAST:  75mL OMNIPAQUE  IOHEXOL  350 MG/ML SOLN COMPARISON:  CT scan chest from 02/21/2023. FINDINGS: Cardiovascular: No evidence of embolism to the proximal subsegmental pulmonary artery level. Normal cardiac size. No pericardial effusion. No aortic aneurysm. Mediastinum/Nodes: Visualized thyroid gland appears grossly unremarkable. No solid / cystic mediastinal masses. The esophagus is nondistended precluding optimal assessment. No axillary, mediastinal or hilar lymphadenopathy by size criteria. Lungs/Pleura: The central tracheo-bronchial tree is patent. Redemonstration of focal airspace opacity in the left lung  lower lobe, favored to represent pneumonia. There is trace left pleural effusion. There are additional patchy areas of linear, plate-like atelectasis and/or scarring throughout bilateral lungs. No suspicious mass or pneumothorax. No right pleural effusion. No suspicious lung nodules. Upper Abdomen: Surgically absent gallbladder. Remaining visualized upper abdominal viscera within normal limits. Musculoskeletal: The visualized soft tissues of the chest wall are grossly unremarkable. No suspicious osseous lesions. Redemonstration of lower thoracic/upper lumbar spinal fixation hardware. There are overlying skin staples. Review of the MIP images confirms the above findings. IMPRESSION: 1. No embolism to the proximal subsegmental pulmonary artery level. 2. Redemonstration of focal airspace opacity in the left lung lower lobe, favored to represent pneumonia. 3. Multiple other nonacute observations, as described above. Electronically Signed   By: Ree Molt M.D.   On: 02/22/2023 13:39   There are no new results to review at this time.  Assessment and Plan:  * Pneumonia due to COVID-19 virus Patient presenting with 3 days of fever and shortness of breath on exertion noted to have evidence of pneumonia on CT imaging and PCR positive for COVID #19.  - Continuous pulse oximetry - Continue supplemental oxygen to maintain oxygen saturation above 88% - Wean as tolerated - Decadron  6 mg IV daily till discharge - Supportive management with guaifenesin  as needed  Fusion of spine of lumbar region Patient recently underwent pelvic to T10 laminectomy and fusion on 1/6 at [facility] and was discharged on 1/8.  Unfortunately, she has been having difficulty receiving her medications at [facility] and is experiencing significant pain.  CT yesterday unremarkable however serosanguinous drainage noted.  Discussed with neurosurgery here at [facility], who notes drainage is not expected at this stage.  Will discuss with the EDP to  see if transfer to [facility] arranged, and if not, will request neurosurgery evaluate patient here.  - Neurosurgery consulted; appreciate their recommendations - Pain control with Tylenol , Oxycodone , Dilaudid  and Toradol    Postoperative anemia Prior to recent surgery, patient's hemoglobin was noted to be within normal range at 13.4, down to 8.7 postop.  Currently 7.4, however stable over the last 24 hours.  Will monitor very closely given use of Toradol  for pain control.  - Repeat CBC in the a.m. - Transfuse for hemoglobin less than 7 - Start iron  supplementation  Recurrent major depression (HCC) - Continue home regimen  Hypertension, essential - Continue home antihypertensives  Hypothyroid - Continue home Synthroid   Advance Care Planning:   Code  Status: Full Code   Consults: Neurosurgery  Family Communication: No family at bedside.   Severity of Illness: The appropriate patient status for this patient is OBSERVATION. Observation status is judged to be reasonable and necessary in order to provide the required intensity of service to ensure the patient's safety. The patient's presenting symptoms, physical exam findings, and initial radiographic and laboratory data in the context of their medical condition is felt to place them at decreased risk for further clinical deterioration. Furthermore, it is anticipated that the patient will be medically stable for discharge from the hospital within 2 midnights of admission.   Author: Clayborne Broom, MD 02/22/2023 3:33 PM  For on call review [email or web address].

## 2023-02-22 NOTE — Assessment & Plan Note (Signed)
 Continue home antihypertensives

## 2023-02-22 NOTE — Assessment & Plan Note (Signed)
 Patient recently underwent pelvic to T10 laminectomy and fusion on 1/6 at [facility] and was discharged on 1/8.  Unfortunately, she has been having difficulty receiving her medications at [facility] and is experiencing significant pain.  CT yesterday unremarkable however serosanguinous drainage noted.  Discussed with neurosurgery here at [facility], who notes drainage is not expected at this stage.  Will discuss with the EDP to see if transfer to [facility] arranged, and if not, will request neurosurgery evaluate patient here.  - Neurosurgery consulted; appreciate their recommendations - Pain control with Tylenol , Oxycodone , Dilaudid  and Toradol 

## 2023-02-22 NOTE — Assessment & Plan Note (Signed)
 Continue home Synthroid

## 2023-02-27 LAB — CULTURE, BLOOD (ROUTINE X 2)
Culture: NO GROWTH
Culture: NO GROWTH
Special Requests: ADEQUATE

## 2023-09-16 IMAGING — CR DG CHEST 2V
2 series · 2 of 2 positions shown · non-contrast
Comparison: None.

CLINICAL DATA: COVID positive.

EXAM:
CHEST - 2 VIEW

[chest pa]
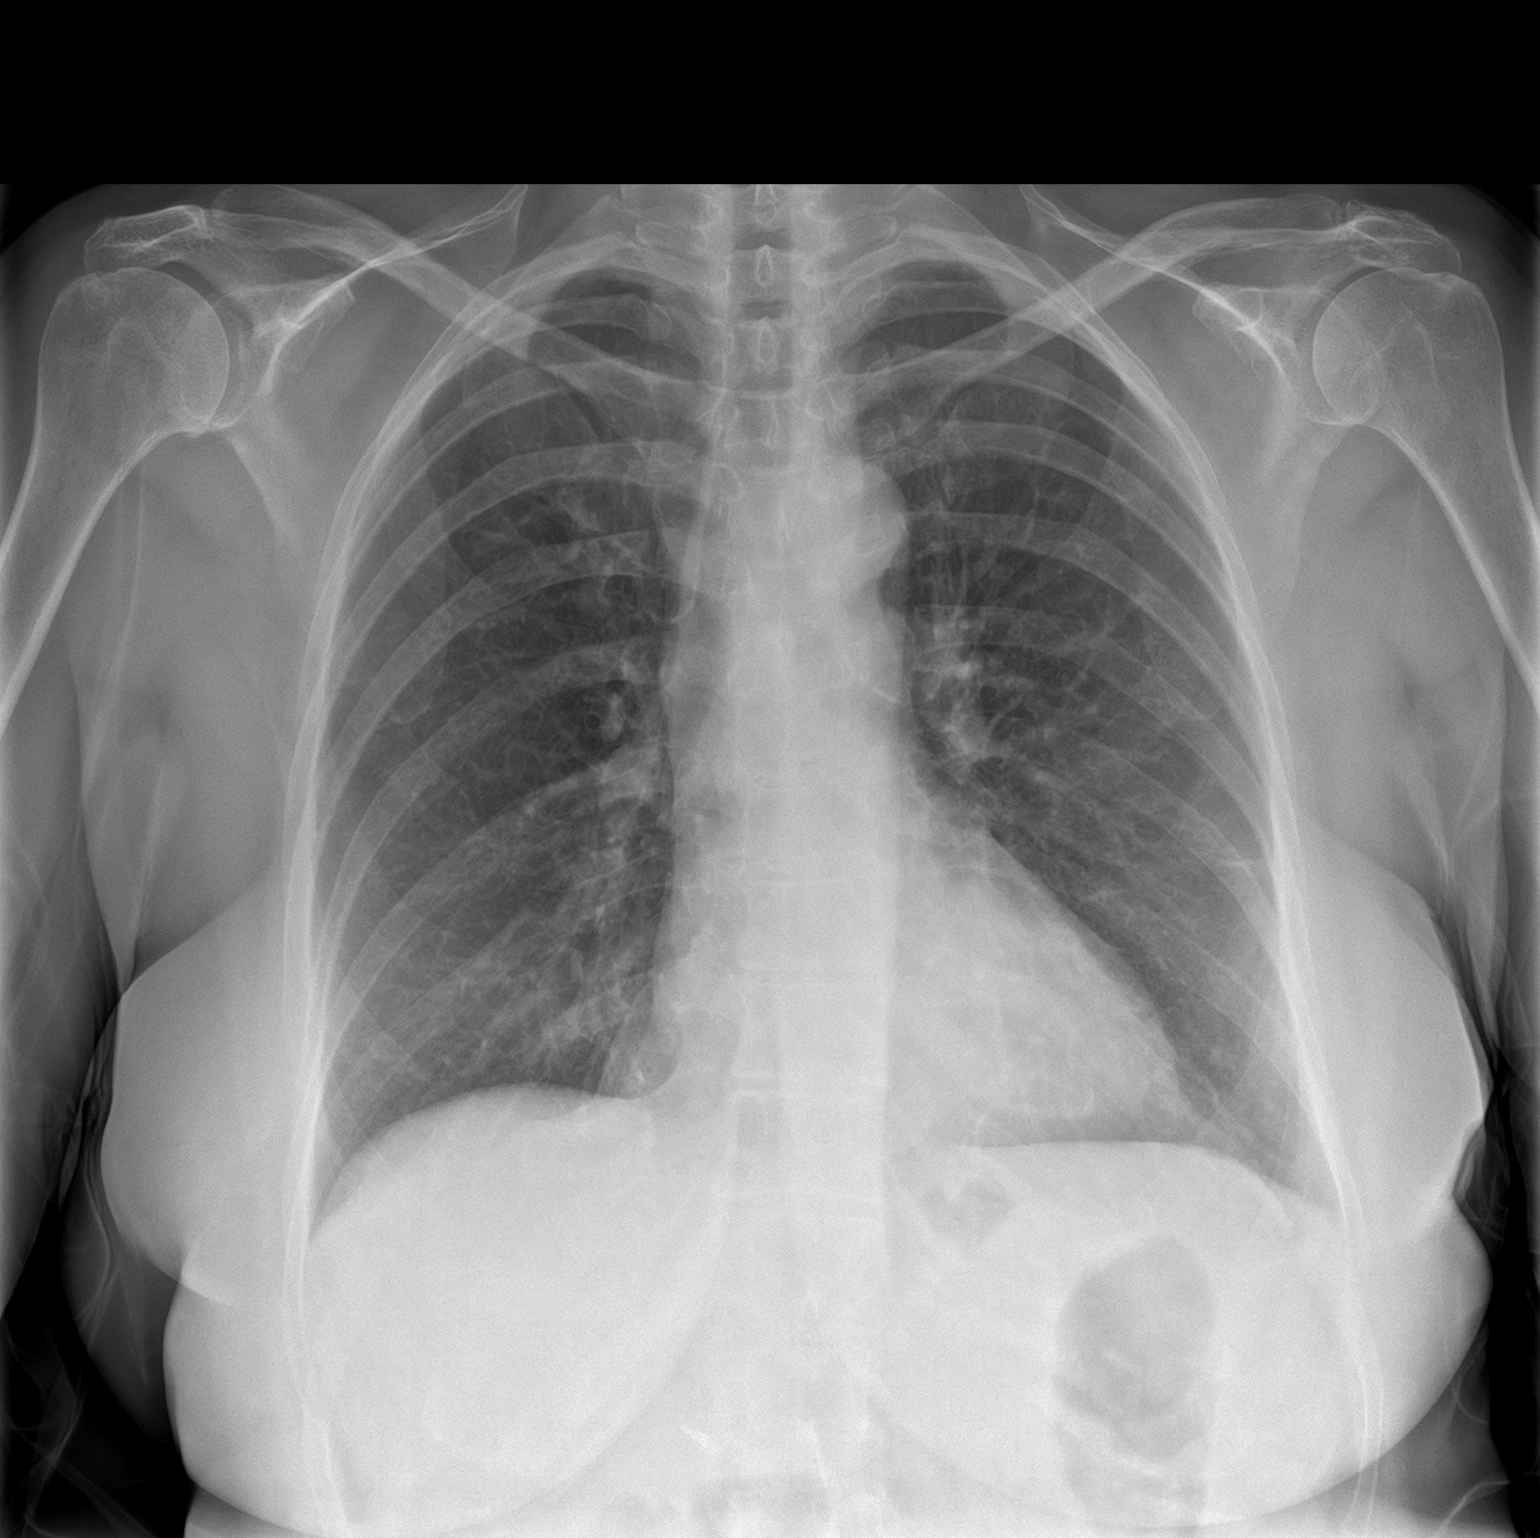

[chest lat]
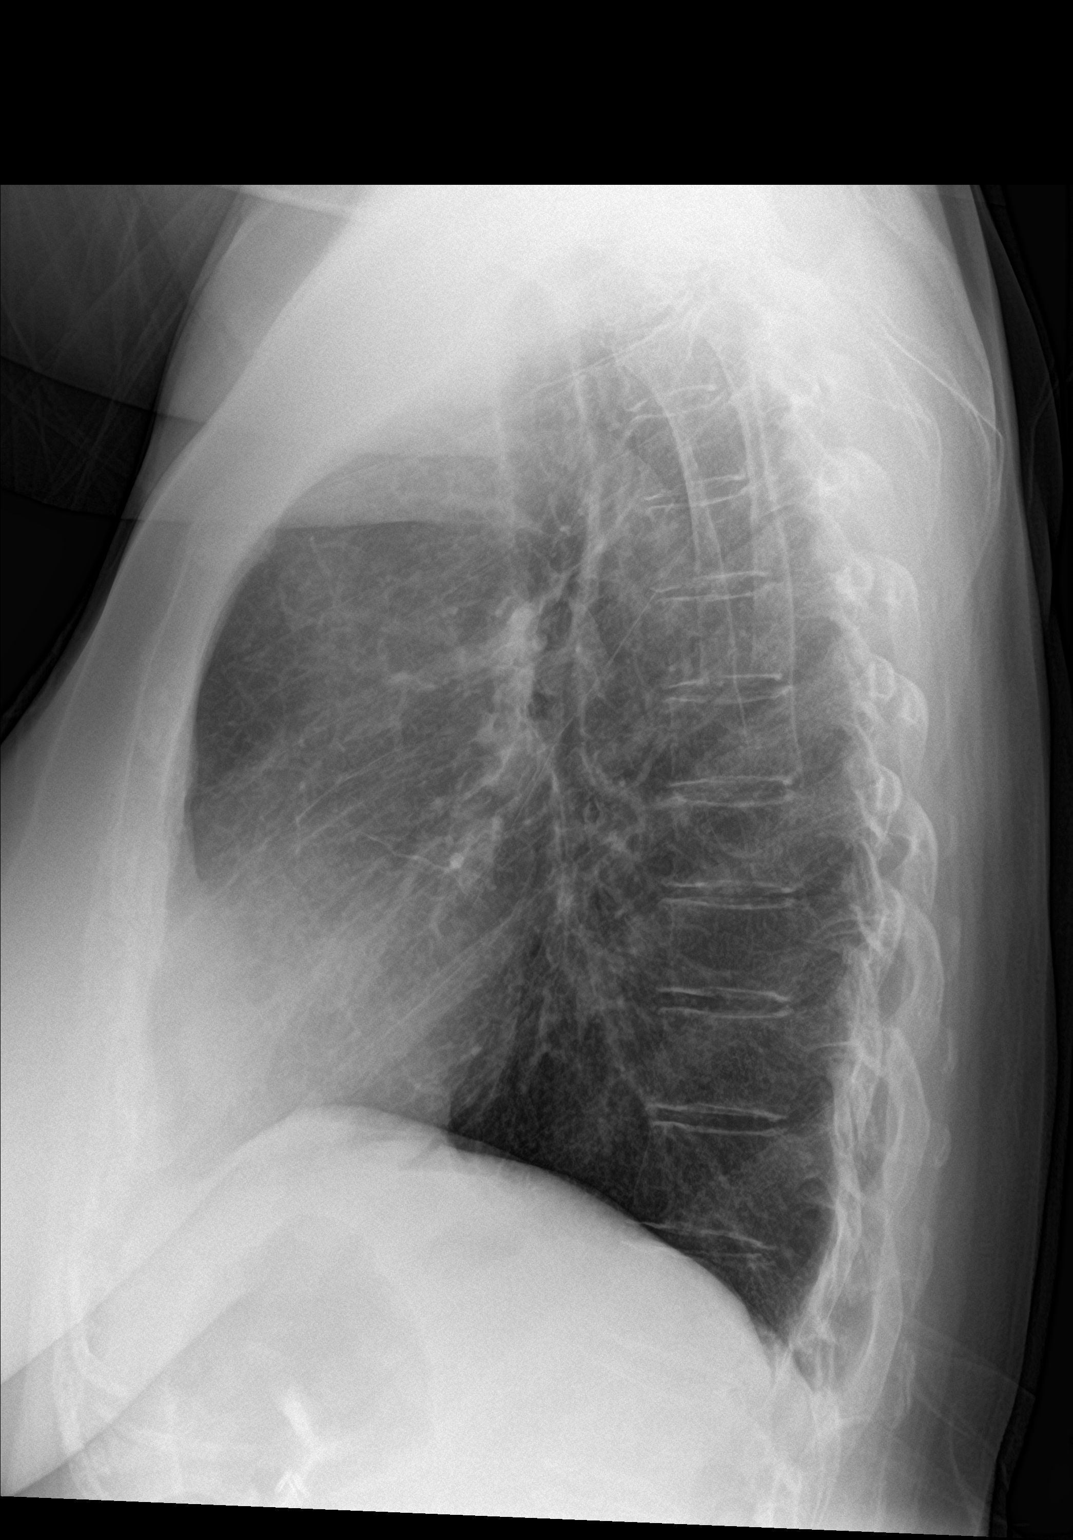

[2 of 2 positions shown; findings below may reference images not displayed]

FINDINGS: Trachea is midline. Heart size normal. Minimal streaky atelectasis
or scarring in the lingula. No airspace consolidation or pleural
fluid.
IMPRESSION: No acute findings.

## 2023-11-07 NOTE — Progress Notes (Signed)
 This video encounter was conducted with the patient's (or proxy's) verbal consent via secure, interactive audio and video telecommunications.    The patient (or proxy) was instructed by the virtual care center staff to have this encounter in a suitably private space and to only have persons present to whom they give permission to participate. In addition, patient identity was confirmed by use of name and date of birth.   Telehealth visit was conducted with Kelli Pruitt via HIPAA compliant video platform.

## 2023-11-07 NOTE — Progress Notes (Signed)
 Telemedicine video encounter documentation  This video encounter was conducted with the patient's (or proxy's) verbal consent via secure, interactive audio and video telecommunications while in clinic/office/hospital.  The patient (or proxy) was instructed to have this encounter in a suitably private space and to only have persons present to whom they give permission to participate. In addition, patient identity was confirmed by use of name plus an additional identifier.   Chief Complaint:   Chief Complaint  Patient presents with   Weight management    Doesn't feel like weight mgt is going well s/p husband's stroke a month ago.     Subjective:    Kelli Pruitt is a pleasant 68 y.o. female established patient.  Starting weight 07/07/2023: 204lbs Current weight: 207lbs   History of Present Illness Kelli Pruitt is a 68 year old female who presents with stress and sleep disturbances due to her husband's recent medical issues.  Her husband suffered a stroke four weeks ago, leading to multiple hospital visits and rehabilitation. He was initially hospitalized for a week, followed by two weeks in rehab. After being sent home, he experienced multiple falls and episodes of paralysis, with the most recent lasting two days, resulting in his current hospitalization.  She experiences significant stress and sleep deprivation, stating she has not slept for the last four nights. Her life has been significantly impacted, describing it as 'placed on hold' due to her husband's condition and her own spinal surgery recovery.  She is currently engaging in walking as a form of physical activity.   REVIEW OF SYSTEMS:Pertinent negatives and positives are noted in HPI.  HPI  Patient Active Problem List  Diagnosis   Fibromyalgia   Anxiety   Endometriosis of ovary   Hypothyroid   Major depressive disorder, recurrent, unspecified ()   ADHD (attention deficit hyperactivity disorder),  inattentive type   S/P reverse total shoulder arthroplasty, right   Scoliosis of lumbar spine   Spinal stenosis of lumbar region   Spondylolisthesis   Rheumatoid arthritis, unspecified (CMS/HHS-HCC)   Ventral hernia without obstruction or gangrene   Umbilical hernia without obstruction and without gangrene   Inguinal hernia   Hypertension, essential   Cervical spondylosis with myelopathy and radiculopathy   Degenerative scoliosis   Fusion of spine of lumbar region   Obesity (BMI 30.0-34.9)   Vitamin D deficiency   Pure hypercholesterolemia   Pneumonia due to COVID-19 virus   Osteoporosis   Migraine, unspecified, not intractable, without status migrainosus   Atherosclerosis of coronary artery without angina pectoris    Outpatient Medications Prior to Visit  Medication Sig Dispense Refill   ascorbic acid, vitamin C, (VITAMIN C) 500 MG tablet Take 1,000 mg by mouth once daily     aspirin 81 MG EC tablet Take 81 mg by mouth once daily     atomoxetine (STRATTERA) 100 MG capsule TAKE 1 CAPSULE BY MOUTH ONCE DAILY. 90 capsule 0   atorvastatin  (LIPITOR) 20 MG tablet TAKE 1 TABLET BY MOUTH EVERY DAY 100 tablet 3   carvediloL (COREG) 25 MG tablet Take 1 tablet (25 mg total) by mouth 2 (two) times daily with meals 60 tablet 11   cholecalciferol (VITAMIN D3) 1000 unit tablet Take 1,000 Units by mouth every morning     DULoxetine (CYMBALTA) 30 MG DR capsule TAKE 1 CAPSULE BY MOUTH EVERY DAY 90 capsule 2   DULoxetine (CYMBALTA) 60 MG DR capsule Take 60 mg by mouth once daily Take 1 capsule with a 30 mg  capsule for a total of 90 mg daily     DULoxetine (CYMBALTA) 60 MG DR capsule TAKE 1 CAPSULE BY MOUTH EVERY DAY 90 capsule 1   gabapentin (NEURONTIN) 300 MG capsule Take 2 capsules (600 mg total) by mouth 3 (three) times daily 180 capsule 2   hydroCHLOROthiazide (HYDRODIURIL) 25 MG tablet TAKE 1 TABLET BY MOUTH EVERY DAY 100 tablet 3   ibuprofen  (MOTRIN ) 800 MG  tablet TAKE 1 TABLET BY MOUTH EVERY 8 HOURS AS NEEDED FOR PAIN 30 tablet 3   levothyroxine  (UNITHROID ) 50 MCG tablet TAKE 1 TABLET DAILY ON EMPTY STOMACH WITH A GLASS OF WATER AT LEAST 30-60 MINUTES BEFORE BREAKFAST 100 tablet 3   magnesium oxide (MAG-OX) 400 mg (241.3 mg magnesium) tablet Take 1 tablet (400 mg total) by mouth 2 (two) times daily     multivitamin tablet Take 1 tablet by mouth every morning     nystatin (MYCOSTATIN) 100,000 unit/gram powder Apply topically 2 (two) times daily 60 g 2   olmesartan (BENICAR) 20 MG tablet TAKE 1 TABLET BY MOUTH EVERY DAY 100 tablet 3   oxyCODONE  (ROXICODONE ) 5 MG immediate release tablet Take 1 tablet (5 mg total) by mouth every 6 (six) hours as needed for Pain 120 tablet 0   [START ON 11/26/2023] oxyCODONE  (ROXICODONE ) 5 MG immediate release tablet Take 1 tablet (5 mg total) by mouth every 6 (six) hours as needed for Pain 120 tablet 0   [START ON 12/26/2023] oxyCODONE  (ROXICODONE ) 5 MG immediate release tablet Take 1 tablet (5 mg total) by mouth every 6 (six) hours as needed for Pain 120 tablet 0   tiZANidine (ZANAFLEX) 4 MG tablet Take 1.5 tablets (6 mg total) by mouth 3 (three) times daily as needed for Muscle spasms 135 tablet 2   traZODone  (DESYREL ) 50 MG tablet TAKE 1.5 TABLETS BY MOUTH AT BEDTIME. 135 tablet 3   naloxone (NARCAN) 4 mg/actuation nasal spray Place 1 spray (4 mg total) into one nostril once as needed for up to 1 dose 1 each 0   No facility-administered medications prior to visit.    Allergies  Allergen Reactions   Norvasc [Amlodipine] Other (See Comments)    Dizziness, nausea, malaise, sob,   Lisinopril Angioedema    Acutely developed lip swelling 1/27 while taking lisinpril    Social History   Socioeconomic History   Marital status: Married    Spouse name: Danial Ortego   Number of children: 7  Occupational History   Occupation: life coach  Tobacco Use   Smoking status: Former    Current packs/day:  0.00    Average packs/day: 2.5 packs/day for 8.0 years (20.0 ttl pk-yrs)    Types: Cigarettes    Start date: 02/08/1977    Quit date: 02/08/1985    Years since quitting: 38.7   Smokeless tobacco: Never   Tobacco comments:    Smoked on and off for 10 years  Vaping Use   Vaping status: Never Used  Substance and Sexual Activity   Alcohol use: Not Currently    Alcohol/week: 2.0 standard drinks of alcohol    Types: 1 Glasses of wine, 1 Drinks containing 0.5 oz of alcohol per week    Comment: per week   Drug use: Never   Sexual activity: Not Currently    Partners: Male    Birth control/protection: Post-menopausal    Comment: tubal ligation   Other Topics Concern   Would you please tell us  about the people who live in your  home, your pets, or anything else important to your social life? Yes    Comment: Married, 4 kids, 6 grdkids, 2 grt grdkids, educational psychologist lover  Social History Narrative   09/21/2023   Hotel Manager Service: None      Work outside the home: Retired-       Persons in the home: Pt and spounse   Home: Two Story   Your Bedrooms is on: Second Level   Fewest Steps to enter the home: 1   Family: family members nearby   Pets: Warehouse Manager you use daily: Medical Laboratory Scientific Officer, Paediatric Nurse, and Environmental Consultant  - interested in getting a rollator   Sleep: not sleeping well, sleeping in chair for back support, chronic insomnia   Hobbies/social:                            Social Drivers of Corporate Investment Banker Strain: Low Risk  (09/30/2023)   Overall Financial Resource Strain (CARDIA)    Difficulty of Paying Living Expenses: Not hard at all  Recent Concern: Financial Resource Strain - High Risk (09/21/2023)   Overall Financial Resource Strain (CARDIA)    Difficulty of Paying Living Expenses: Hard  Food Insecurity: No Food Insecurity (09/30/2023)   Hunger Vital Sign    Worried About Running Out of Food in the Last Year: Never true    Ran Out of Food in the Last Year: Never  true  Recent Concern: Food Insecurity - Food Insecurity Present (09/21/2023)   Hunger Vital Sign    Worried About Running Out of Food in the Last Year: Sometimes true    Ran Out of Food in the Last Year: Sometimes true  Transportation Needs: No Transportation Needs (09/30/2023)   PRAPARE - Administrator, Civil Service (Medical): No    Lack of Transportation (Non-Medical): No  Physical Activity: Sufficiently Active (10/20/2022)   Exercise Vital Sign    Days of Exercise per Week: 7 days    Minutes of Exercise per Session: 40 min  Stress: Stress Concern Present (10/20/2022)   [employer] of Occupational Health - Occupational Stress Questionnaire    Feeling of Stress : Rather much  Social Connections: Moderately Integrated (10/20/2022)   Social Connection and Isolation Panel    Frequency of Communication with Friends and Family: Once a week    Frequency of Social Gatherings with Friends and Family: Never    Attends Religious Services: More than 4 times per year    Active Member of [employer] or Organizations: Yes    Attends Engineer, Structural: More than 4 times per year    Marital Status: Married  Housing Stability: Low Risk  (09/30/2023)   Housing Stability Vital Sign    Unable to Pay for Housing in the Last Year: No    Number of Times Moved in the Last Year: 0    Homeless in the Last Year: No  Recent Concern: Housing Stability - High Risk (09/21/2023)   Housing Stability Vital Sign    Unable to Pay for Housing in the Last Year: Yes    Number of Times Moved in the Last Year: 0    Homeless in the Last Year: No    ROS  See HPI for other ROS    Objective:   Vitals as reported by patient: Vitals:   11/07/23 1017  Weight: 93.9 kg (207 lb)  Height: 170.2 cm (5' 7)  PainSc:  6  PainLoc: Generalized   Body mass index is 32.42 kg/m.  Constitutional: alert, interactive with provider, cooperative, in no distress Mental status: oriented x  3, good historian, appropriate mood and behavior, thought content appears normal Respiratory: no respiratory distress, no audible wheezing    Assessment/Plan:   There are no diagnoses linked to this encounter.   Assessment and Plan: Roshawnda Pecora is a 68 y.o. female   Assessment & Plan Obesity Obesity management is deferred due to significant personal stressors, including her husband's recent stroke and hospitalization. Current focus is on self-care and managing stress. - Postpone weight management efforts until personal circumstances stabilize. - Encourage walking and maintaining hydration as feasible. - Focus on self-care and managing stress.   Patient expressed understanding and agrees with treatment plan. All questions answered.  I have reviewed the patient's previous medical records.    This visit was coded based on medical decision making (MDM).     No follow-ups on file.  Future Appointments     Date/Time Provider Department Center Visit Type   11/22/2023 10:30 AM (Arrive by 10:15 AM) Marea, Vernell Norris, MD [facility] [place] PERIN VIDEO VISIT RETURN   11/28/2023 11:00 AM (Arrive by 10:45 AM) Robert Mahalia Garre, PhD [facility] VIDEO VISIT NEW   12/28/2023 2:30 PM Lesia Latisha Hauser, MD [facility] Comprehensive EYE OPH COS NEW   01/04/2024 2:20 PM (Arrive by 2:05 PM) Joshua Norleen Rattler, DO [facility] CROASDAILE CARD RETURN   01/12/2024 2:30 PM (Arrive by 2:00 PM) Tamsen Maude Rayfield Learta, MD; [facility] CLINIC PROCEDURES [facility] PAIN PROCEDURE [place]   01/19/2024 1:30 PM [facility] MM 1 Mammography at [facility] DI[facility]NOSTIC DIGITAL LEFT   01/26/2024 1:00 PM (Arrive by 12:30 PM) Yvone Etha Reusing, NP [facility] RETURN VISIT   03/12/2024 8:40 AM (Arrive by 8:25 AM) Ary, Garre Marseille, MD [facility]  [place] ENDO VIDEO VISIT RETURN ADULT   09/21/2024 10:00 AM (Arrive by 9:45 AM) [facility] [facility] [address] [place] VIDEO VISIT WELLNESS       There are no Patient Instructions on file for this visit.   Parts of this note may have been written using Dragon dictation. There may be spelling and/or grammatical errors and phrases that were mistakenly interpreted by the software and missed in my review.

## 2023-12-01 NOTE — Progress Notes (Signed)
 After all screening questions were asked and patient was given the opportunity to read the flu VIS, Flu vaccine 0.5 ml given IM in patient's right deltoid.   Patient tolerated vaccine well.

## 2023-12-15 NOTE — Progress Notes (Signed)
 After all screening questions were asked and patient was given opportunity to read VIS , mNexpsike  0.2 ml given IM in patient's left deltoid deltoid. Patient tolerated well. SABRA

## 2024-02-03 NOTE — Progress Notes (Signed)
 "   [facility] Provider Note Chief Complaint  Kelli Pruitt is a 68 y.o. female who presents to urgent care with the following concerns: Cough (X 10 days productive cough) and Fever (X 10 days highest was 104 F )  Subjective, HPI   History of Present Illness Kelli Pruitt is a 67 year old female with rheumatoid arthritis and hypertension who presents with respiratory symptoms.  Over the past ten days, she has experienced cough, cold, fever, diarrhea, and vomiting. The fever was present daily until yesterday.  Breathing triggers coughing and she feels winded with exertion.  Feels similar to prior respiratory infections.  Denies chest pain or leg swelling, denies history of cardiac disease.  Using ibuprofen  for symptom relief in addition to Coricidin HBP.  Also having some sore throat and sores developing around the mouth, another sign to her that she is experiencing infection.  Continuing to have rhinorrhea/nose blowing.  Primarily bothered by persistent cough but overall feels like her breathing status has improved in the last few days and again confirms that fever has not been present since yesterday.  Her husband has been ill with similar symptoms and was hospitalized, where he was tested for various viruses, including RSV, and was found to have a positive result for another virus through the extended respiratory panel (result unknown per patient).   She is not on any chronic medications for rheumatoid arthritis and denies the use of immunosuppressants. Her blood pressure today is 101/66, which she states is normal for her. She is a nonsmoker. She has no known allergies to medications.  Patient's problem list, past medical and social history, medications, and allergies were reviewed by me and updated in Epic.   ROS  See HPI.  Objective   Vitals:   02/03/24 1805  BP: 101/66  Pulse: 69  Resp: 18  Temp: 36.7 C (98.1 F)  TempSrc: Oral  SpO2: 95%  Weight: 94.8 kg (208  lb 15.9 oz)  PainSc: 0-No pain   Vital signs and nursing note reviewed.   General: Appears well-developed and well-nourished. No acute distress.  Generally well-appearing, slightly tired, but not acutely ill or toxic.  Conversational.  Pleasant. HEENT Head: Normocephalic and atraumatic.  Eyes: Conjunctivae and EOM are normal.  Ears: Hearing grossly intact, no visible deformity.  Nose: No nasal deviation.  Rhinorrhea present with maxillary sinus tenderness bilaterally. Mouth/Throat: No stridor or tracheal deviation. Posterior oropharynx: Moist mucous membranes. Mild erythema present. Uvula midline. No trismus or drooling.  No obvious sores or lesions. Neck: Normal range of motion, neck is supple. Tonsillar LAD present, without cervical LAD .  Cardiovascular: Normal rate and regular rhythm; no murmurs, gallops, or rubs.  No BLE edema. Pulm/Chest: No respiratory distress. No accessory muscle usage, speaking in full sentences.  Diffuse loud rhonchi throughout, no associated wheezing.  No crackles. Musculoskeletal: No joint deformity, normal range of motion.  Neurological: Alert and oriented to person, place, and time.  Skin: Skin is warm and dry.  Psychiatric: Normal mood, affect, behavior, and thought content.    Data   Results for orders placed or performed in visit on 02/03/24  POC Influenza A and/or B Rapid Assay  Result Value Ref Range   POC Influenza A RNA Not Detected Not Detected   POC Influenza B RNA Not Detected Not Detected   Narrative   POC TEST(S) ABOVE PERFORMED AT THE PATIENT CARE LOCATION AND OVERSEEN BY THE [facility] POCT PROGRAM.   POC Coronavirus (COVID-19) SARS-Cov-2  Rapid Test  Result Value Ref Range   POC Coronavirus (COVID-19) SARS-CoV-2 Rapid Test Not Detected Not Detected   Narrative   POC TEST(S) ABOVE PERFORMED AT THE PATIENT CARE LOCATION AND OVERSEEN BY THE [facility] POCT PROGRAM.  Your COVID-19 coronavirus test was negative (i.e., the COVID-19 coronavirus was NOT  detected in your sample). This means you are unlikely to be infected with the COVID-19 coronavirus unless you have recent exposures to a known COVID-19-positive person or new symptoms since the sample was collected. If your symptoms worsen, please call your provider's office during business hours, or the main [facility] COVID hotline at [phone] (weekdays 8am-5pm) and Employee Health COVID hotline (option 1) (weekdays 8am-5pm and weekends 8am-12pm). If you are experiencing a life-threatening emergency, please call 911.     POC COVID: Negative POC Flu: Negative  Imaging Chest XR: On my read: No evidence of acute infiltrate or opacity.  Thoracic hardware noted. Awaiting final radiology interpretation.  Assessment & Plan     ICD-10-CM  1. Acute bacterial bronchitis  J20.8   B96.89  2. Upper respiratory symptom  R09.89  3. Cough, unspecified type  R05.9     Assessment & Plan Acute bacterial bronchitis Cough Symptoms persisting for ten days, including cough, fever, and shortness of breath. No history of lung disease and she is a non-smoker. Chest x-ray shows no obvious pneumonia, but lungs sound poor with rhonchi throughout. Differential includes bacterial bronchitis.  Considered cardiac etiology but do not suspect new onset CHF in the absence of edema.  I do not suspect ACS or PE in the absence of associated chest pain, and given associated classic respiratory symptoms as well.  Vitals normal.  States BP near baseline for her.  Fever has resolved, and she is comfortable deferring further laboratory studies like CBC and chemistry panel today.  COVID and flu are negative, husband hospitalized with other virus which was apparently positive on the extended respiratory panel but patient uncertain as to which this was (unlikely to change management for patient today). Doxycycline  chosen for its coverage of atypical infections and anti-inflammatory properties. - Prescribed doxycycline  100 mg twice daily for  10 days. - Prescribed Tessalon Perles for cough, to be taken three times a day as needed. - Advised hot, steamy showers and hydration. - Recommended throat lozenges with cherry or honey to keep mucus moving. - Suggested nasal sprays like Flonase for nasal congestion. - Instructed to monitor for worsening symptoms such as inability to catch breath, chest pain, or return of fever, which would necessitate reevaluation. - Primary care if not gradually improving in about 3-5 days.  Requested Prescriptions   Signed Prescriptions Disp Refills   doxycycline  (VIBRAMYCIN ) 100 MG capsule 20 capsule 0    Sig: Take 1 capsule (100 mg total) by mouth 2 (two) times daily for 10 days   benzonatate (TESSALON) 200 MG capsule 30 capsule 0    Sig: Take 1 capsule (200 mg total) by mouth 3 (three) times daily as needed for Cough for up to 10 days    This is an ESTABLISHED Patient with [facility].    Damien Carlyon Carney, PA-C 02/03/2024  Future Appointments    Date/Time Provider Department Center Visit Type   02/15/2024 10:30 AM [facility] MM 1 Mammography at [facility] MM DIAGNOSTIC DIGITAL LEFT   02/22/2024 1:00 PM (Arrive by 12:45 PM) Filbeck, Jacquline Marisa, LCSW [facility] VIDEO VISIT RETURN   03/12/2024 8:40 AM (Arrive by 8:25 AM)  Jelesoff, Nicole Elise, MD [facility] [place] ENDO VIDEO VISIT RETURN ADULT   04/03/2024 3:15 PM (Arrive by 3:00 PM) Lawyer Maurilio Quant, MD [facility] D[facility] RET PATIENT   07/25/2024 2:30 PM Lesia Latisha Hauser, MD [facility] Surg Comprehensive EYE OPH COS NEW   09/21/2024 10:00 AM (Arrive by 9:45 AM) [facility] [facility] [address] [place] VIDEO VISIT WELLNESS       This note has been created using automated tools and reviewed for accuracy by dino Damien Carney PA-C. Obtained informed consent to utilize [employer].  This note was partially made with the  aid of speech-to-text dictation; typographical errors are not intentional. "

## 2024-02-06 ENCOUNTER — Emergency Department
Admission: EM | Admit: 2024-02-06 | Discharge: 2024-02-07 | Disposition: A | Attending: Emergency Medicine | Admitting: Emergency Medicine

## 2024-02-06 ENCOUNTER — Other Ambulatory Visit: Payer: Self-pay

## 2024-02-06 ENCOUNTER — Encounter: Payer: Self-pay | Admitting: *Deleted

## 2024-02-06 ENCOUNTER — Emergency Department

## 2024-02-06 DIAGNOSIS — W1830XA Fall on same level, unspecified, initial encounter: Secondary | ICD-10-CM | POA: Diagnosis not present

## 2024-02-06 DIAGNOSIS — R1084 Generalized abdominal pain: Secondary | ICD-10-CM | POA: Insufficient documentation

## 2024-02-06 DIAGNOSIS — S82432A Displaced oblique fracture of shaft of left fibula, initial encounter for closed fracture: Secondary | ICD-10-CM | POA: Diagnosis not present

## 2024-02-06 DIAGNOSIS — E039 Hypothyroidism, unspecified: Secondary | ICD-10-CM | POA: Diagnosis not present

## 2024-02-06 DIAGNOSIS — J40 Bronchitis, not specified as acute or chronic: Secondary | ICD-10-CM | POA: Diagnosis not present

## 2024-02-06 DIAGNOSIS — R911 Solitary pulmonary nodule: Secondary | ICD-10-CM | POA: Insufficient documentation

## 2024-02-06 DIAGNOSIS — B349 Viral infection, unspecified: Secondary | ICD-10-CM | POA: Insufficient documentation

## 2024-02-06 DIAGNOSIS — R0602 Shortness of breath: Secondary | ICD-10-CM | POA: Diagnosis not present

## 2024-02-06 DIAGNOSIS — R112 Nausea with vomiting, unspecified: Secondary | ICD-10-CM

## 2024-02-06 DIAGNOSIS — R918 Other nonspecific abnormal finding of lung field: Secondary | ICD-10-CM | POA: Diagnosis not present

## 2024-02-06 DIAGNOSIS — S99922A Unspecified injury of left foot, initial encounter: Secondary | ICD-10-CM | POA: Diagnosis present

## 2024-02-06 DIAGNOSIS — I10 Essential (primary) hypertension: Secondary | ICD-10-CM | POA: Insufficient documentation

## 2024-02-06 DIAGNOSIS — S82892A Other fracture of left lower leg, initial encounter for closed fracture: Secondary | ICD-10-CM

## 2024-02-06 DIAGNOSIS — S92415A Nondisplaced fracture of proximal phalanx of left great toe, initial encounter for closed fracture: Secondary | ICD-10-CM | POA: Diagnosis not present

## 2024-02-06 LAB — HEPATIC FUNCTION PANEL
ALT: 18 U/L (ref 0–44)
AST: 24 U/L (ref 15–41)
Albumin: 4.6 g/dL (ref 3.5–5.0)
Alkaline Phosphatase: 143 U/L — ABNORMAL HIGH (ref 38–126)
Bilirubin, Direct: 0.3 mg/dL — ABNORMAL HIGH (ref 0.0–0.2)
Indirect Bilirubin: 0.4 mg/dL (ref 0.3–0.9)
Total Bilirubin: 0.7 mg/dL (ref 0.0–1.2)
Total Protein: 7.9 g/dL (ref 6.5–8.1)

## 2024-02-06 LAB — BASIC METABOLIC PANEL WITH GFR
Anion gap: 13 (ref 5–15)
BUN: 20 mg/dL (ref 8–23)
CO2: 30 mmol/L (ref 22–32)
Calcium: 10.4 mg/dL — ABNORMAL HIGH (ref 8.9–10.3)
Chloride: 100 mmol/L (ref 98–111)
Creatinine, Ser: 0.95 mg/dL (ref 0.44–1.00)
GFR, Estimated: >60 mL/min
Glucose, Bld: 158 mg/dL — ABNORMAL HIGH (ref 70–99)
Potassium: 4.7 mmol/L (ref 3.5–5.1)
Sodium: 143 mmol/L (ref 135–145)

## 2024-02-06 LAB — LIPASE, BLOOD: Lipase: 14 U/L (ref 11–51)

## 2024-02-06 LAB — CBC
HCT: 39.9 % (ref 36.0–46.0)
Hemoglobin: 13.5 g/dL (ref 12.0–15.0)
MCH: 30.7 pg (ref 26.0–34.0)
MCHC: 33.8 g/dL (ref 30.0–36.0)
MCV: 90.7 fL (ref 80.0–100.0)
Platelets: 341 K/uL (ref 150–400)
RBC: 4.4 MIL/uL (ref 3.87–5.11)
RDW: 11.4 % — ABNORMAL LOW (ref 11.5–15.5)
WBC: 8.6 K/uL (ref 4.0–10.5)
nRBC: 0 % (ref 0.0–0.2)

## 2024-02-06 LAB — TROPONIN T, HIGH SENSITIVITY
Troponin T High Sensitivity: 17 ng/L (ref 0–19)
Troponin T High Sensitivity: 18 ng/L (ref 0–19)

## 2024-02-06 MED ORDER — PREDNISONE 50 MG PO TABS: 50.0000 mg | ORAL_TABLET | Freq: Every day | ORAL | 0 refills | Status: AC

## 2024-02-06 MED ORDER — ONDANSETRON 4 MG PO TBDP: 4.0000 mg | ORAL_TABLET | Freq: Three times a day (TID) | ORAL | 0 refills | Status: AC | PRN

## 2024-02-06 MED ORDER — ONDANSETRON 4 MG PO TBDP
4.0000 mg | ORAL_TABLET | Freq: Once | ORAL | Status: AC
  Administered 2024-02-06: 4 mg via ORAL
  Filled 2024-02-06: qty 1

## 2024-02-06 MED ORDER — METHYLPREDNISOLONE SODIUM SUCC 125 MG IJ SOLR
125.0000 mg | Freq: Once | INTRAMUSCULAR | Status: AC
  Administered 2024-02-06: 125 mg via INTRAVENOUS
  Filled 2024-02-06: qty 2

## 2024-02-06 MED ORDER — SODIUM CHLORIDE 0.9 % IV BOLUS
1000.0000 mL | Freq: Once | INTRAVENOUS | Status: AC
  Administered 2024-02-06: 1000 mL via INTRAVENOUS

## 2024-02-06 MED ORDER — ALBUTEROL SULFATE HFA 108 (90 BASE) MCG/ACT IN AERS: 2.0000 | INHALATION_SPRAY | RESPIRATORY_TRACT | 0 refills | Status: AC | PRN

## 2024-02-06 MED ORDER — ALBUTEROL SULFATE (2.5 MG/3ML) 0.083% IN NEBU
2.5000 mg | INHALATION_SOLUTION | Freq: Once | RESPIRATORY_TRACT | Status: AC
  Administered 2024-02-06: 2.5 mg via RESPIRATORY_TRACT
  Filled 2024-02-06: qty 3

## 2024-02-06 NOTE — ED Triage Notes (Signed)
 Pt comes via EMS from home with sob and not feeling welll for last week. Pt went to UC last week and placed on meds. Pt stats no change. Pt has had fever on and off. Pt has productive cough and dec appetite. Pt states dark stool. Some wheezing on left side per ems.

## 2024-02-06 NOTE — Discharge Instructions (Addendum)
 Fortunately your CT scan of your chest, abdomen, and your pelvis did not show any emergency conditions like infections or blockages that would require hospitalization or surgeries at this time.  You are likely suffering from a virus causing both your respiratory symptoms and your gastrointestinal symptoms, that should get better with time.  Take acetaminophen  650 mg and ibuprofen  400 mg every 6 hours for pain.  Take with food.  For your ankle and toe fracture, keep your foot in the cam boot and use crutches and call Dr. Malvin for a follow-up appointment.  Thank you for choosing us  for your health care today!  Please see your primary doctor this week for a follow up appointment.   If you have any new, worsening, or unexpected symptoms call your doctor right away or come back to the emergency department for reevaluation.  It was my pleasure to care for you today.   Ginnie EDISON Cyrena, MD

## 2024-02-06 NOTE — ED Notes (Signed)
Pt states she is unable to provide a urine sample.

## 2024-02-06 NOTE — ED Triage Notes (Addendum)
 Pt to triage via wheelchair.  Pt brought in via ems from home with sob and diarrhea.  Sx for 2 weeks.  No chest pain.   Pt has a cough.  Pt seen at urgent care last week with similar sx.  Pt alert  speech clear.   Pt fell last night  pt has left foot pain and right shoulder pain also.

## 2024-02-06 NOTE — ED Provider Notes (Signed)
 "  [facility] Provider Note    Event Date/Time   First MD Initiated Contact with Patient 02/06/24 2152     (approximate)   History   Shortness of Breath   HPI  Kelli Pruitt is a 68 y.o. female with a history of hypothyroidism, RA, hypertension, osteoporosis, and migraine who presents with multiple complaints over the last 2 weeks, specifically cough which has recently been productive of greenish sputum and fever although she has not been febrile in last several days.  She reports shortness of breath, nausea and vomiting, decreased appetite, and diarrhea.  She also reports dark stools.  She states the fevers resolved about 3 or 4 days ago.  She still feels really weak.  Her husband had been hospitalized last week and one of the viruses on his extended respiratory panel was positive, although the patient is not sure which one.  The patient went to urgent care 3 days ago and was clinically diagnosed with likely bacterial bronchitis.  She was started on doxycycline  and Tessalon, but states that her symptoms have not improved.  The patient also reports left foot and ankle pain after mechanical fall last night.  I reviewed the past medical records.  The patient was seen in the outpatient office at [facility] on 12/26 reporting cough, fever, diarrhea, and vomiting.  X-ray was negative and she was thought to have acute bacterial bronchitis.  I confirmed that she was started on doxycycline  and Tessalon.  She was negative for influenza and COVID at that time.   Physical Exam   Triage Vital Signs: ED Triage Vitals  Encounter Vitals Group     BP 02/06/24 1646 139/78     Girls Systolic BP Percentile --      Girls Diastolic BP Percentile --      Boys Systolic BP Percentile --      Boys Diastolic BP Percentile --      Pulse Rate 02/06/24 1646 78     Resp 02/06/24 1646 20     Temp 02/06/24 1646 98.7 F (37.1 C)     Temp Source 02/06/24 1646 Oral     SpO2 02/06/24 1646 96 %      Weight 02/06/24 1631 200 lb (90.7 kg)     Height 02/06/24 1631 5' 7 (1.702 m)     Head Circumference --      Peak Flow --      Pain Score 02/06/24 1631 8     Pain Loc --      Pain Education --      Exclude from Growth Chart --     Most recent vital signs: Vitals:   02/06/24 1646 02/06/24 2052  BP: 139/78 (!) 139/90  Pulse: 78 89  Resp: 20 20  Temp: 98.7 F (37.1 C) 98.5 F (36.9 C)  SpO2: 96% 95%     General: Alert, relatively well-appearing, no distress.  CV:  Good peripheral perfusion.  Resp:  Normal effort.  Slight wheezes and coarse breath sounds to bilateral lung bases. Abd:  Soft with no focal tenderness.  No distention.  Other:  Somewhat dry mucous membranes.  Tenderness at the base of the first toe.  Mild tenderness to the ankle bilaterally with no deformity.   ED Results / Procedures / Treatments   Labs (all labs ordered are listed, but only abnormal results are displayed) Labs Reviewed  BASIC METABOLIC PANEL WITH GFR - Abnormal; Notable for the following components:      Result  Value   Glucose, Bld 158 (*)    Calcium  10.4 (*)    All other components within normal limits  CBC - Abnormal; Notable for the following components:   RDW 11.4 (*)    All other components within normal limits  HEPATIC FUNCTION PANEL - Abnormal; Notable for the following components:   Alkaline Phosphatase 143 (*)    Bilirubin, Direct 0.3 (*)    All other components within normal limits  LIPASE, BLOOD  URINALYSIS, ROUTINE W REFLEX MICROSCOPIC  TROPONIN T, HIGH SENSITIVITY  TROPONIN T, HIGH SENSITIVITY     EKG  ED ECG REPORT I, Waylon Cassis, the attending physician, personally viewed and interpreted this ECG.  Date: 02/06/2024 EKG Time: 1639 Rate: 84 Rhythm: normal sinus rhythm QRS Axis: normal Intervals: normal ST/T Wave abnormalities: Nonspecific ST abnormality Narrative Interpretation: no evidence of acute ischemia    RADIOLOGY  Chest x-ray: I  independently viewed and interpreted the images; there is no focal consolidation or edema  XR L foot: Great toe proximal phalanx fracture  PROCEDURES:  Critical Care performed: No  Procedures   MEDICATIONS ORDERED IN ED: Medications  ondansetron  (ZOFRAN -ODT) disintegrating tablet 4 mg (4 mg Oral Given 02/06/24 2237)  methylPREDNISolone  sodium succinate (SOLU-MEDROL ) 125 mg/2 mL injection 125 mg (125 mg Intravenous Given 02/06/24 2237)  albuterol  (PROVENTIL ) (2.5 MG/3ML) 0.083% nebulizer solution 2.5 mg (2.5 mg Nebulization Given 02/06/24 2240)  sodium chloride  0.9 % bolus 1,000 mL (1,000 mLs Intravenous New Bag/Given 02/06/24 2244)     IMPRESSION / MDM / ASSESSMENT AND PLAN / ED COURSE  I reviewed the triage vital signs and the nursing notes.  68 year old female with PMH as noted above presents with cough, shortness of breath, nausea, vomiting, diarrhea, and malaise over the last 2 weeks, not improved after she was started on doxycycline  and Tessalon from urgent care a few days ago.  On exam she is overall relatively well-appearing.  Her vital signs are normal.  Abdomen is soft and nontender.  Lung exam does reveal some coarse breath sounds and wheezes.  Differential diagnosis includes, but is not limited to, acute bronchitis, likely viral, flu or other viral infection, atypical pneumonia, viral gastroenteritis, dehydration, electrolyte abnormality, AKI, other metabolic cause.  There is no evidence of cardiac etiology.  Patient's presentation is most consistent with acute complicated illness / injury requiring diagnostic workup.  The patient is on the cardiac monitor to evaluate for evidence of arrhythmia and/or significant heart rate changes.  The patient waited a significant amount of time, close to 6 hours, before being seen.  Her triage workup is overall reassuring.  BMP and CBC show no acute findings.  LFTs showed a borderline elevation of alkaline phosphatase with no other acute  findings.  Lipase is normal.  Troponins are negative x 2.  Chest x-ray is clear.  X-rays of the foot reveal a fracture at the proximal phalanx of the great toe, although the patient is actually reporting more ankle pain right now.  I have added on ankle x-rays.  We will give fluids and treat with steroid, bronchodilators, and antiemetics.  I anticipate that the patient will be appropriate for discharge home.  ----------------------------------------- 11:09 PM on 02/06/2024 -----------------------------------------  Ankle x-rays and reassessment pending.  Have signed the patient out to the oncoming ED physician Dr. Cyrena.   FINAL CLINICAL IMPRESSION(S) / ED DIAGNOSES   Final diagnoses:  Shortness of breath  Nausea vomiting and diarrhea  Bronchitis  Viral syndrome  Nondisplaced fracture of proximal  phalanx of left great toe, initial encounter for closed fracture     Rx / DC Orders   ED Discharge Orders          Ordered    predniSONE  (DELTASONE ) 50 MG tablet  Daily        02/06/24 2308    ondansetron  (ZOFRAN -ODT) 4 MG disintegrating tablet  Every 8 hours PRN        02/06/24 2308    albuterol  (VENTOLIN  HFA) 108 (90 Base) MCG/ACT inhaler  Every 4 hours PRN        02/06/24 2308             Note:  This document was prepared using Dragon voice recognition software and may include unintentional dictation errors.    Jacolyn Pae, MD 02/06/24 2310  "

## 2024-02-07 ENCOUNTER — Emergency Department

## 2024-02-07 DIAGNOSIS — S92415A Nondisplaced fracture of proximal phalanx of left great toe, initial encounter for closed fracture: Secondary | ICD-10-CM | POA: Diagnosis not present

## 2024-02-07 MED ORDER — ONDANSETRON 4 MG PO TBDP
4.0000 mg | ORAL_TABLET | Freq: Once | ORAL | Status: AC
  Administered 2024-02-07: 4 mg via ORAL
  Filled 2024-02-07: qty 1

## 2024-02-07 MED ORDER — MORPHINE SULFATE (PF) 2 MG/ML IV SOLN
2.0000 mg | Freq: Once | INTRAVENOUS | Status: AC
  Administered 2024-02-07: 2 mg via INTRAVENOUS
  Filled 2024-02-07: qty 1

## 2024-02-07 MED ORDER — IOHEXOL 300 MG/ML  SOLN
100.0000 mL | Freq: Once | INTRAMUSCULAR | Status: AC | PRN
  Administered 2024-02-07: 100 mL via INTRAVENOUS

## 2024-02-07 NOTE — ED Provider Notes (Addendum)
 Nondisplaced ankle fracture, placed in cam boot and given crutches.  Follow-up with podiatry.  Ongoing nausea and worsening abdominal pain with diffuse tenderness.  She states that she has had diarrhea with dark tarry stools and is a frequent NSAID user.  My rectal exam shows yellow stool that is Hemoccult negative.    Given her abdominal tenderness along with her respiratory infectious symptoms, will order a CT abdomen and pelvis and chest with IV contrast.  Give IV pain control.  Anticipate if unremarkable CT scan can be discharged with viral illness, podiatry follow-up as above.  Patient stable with unremarkable CT scan of the chest abdomen and pelvis.  Plan be for discharge with podiatry follow-up.   Cyrena Mylar, MD 02/07/24 9744    Cyrena Mylar, MD 02/07/24 9455    Cyrena Mylar, MD 02/07/24 [id number]

## 2024-03-10 IMAGING — CT CT HEAD W/O CM
4 series · 16 of 47 positions shown, 18 images · non-contrast
Comparison: None Available.

CLINICAL DATA: Trauma



[Series 2: head wo · axial · 0.39mm/px · z∈[-89,+26]mm · 7 of 31 slices shown, 9 images]
[im 4/31  brain]
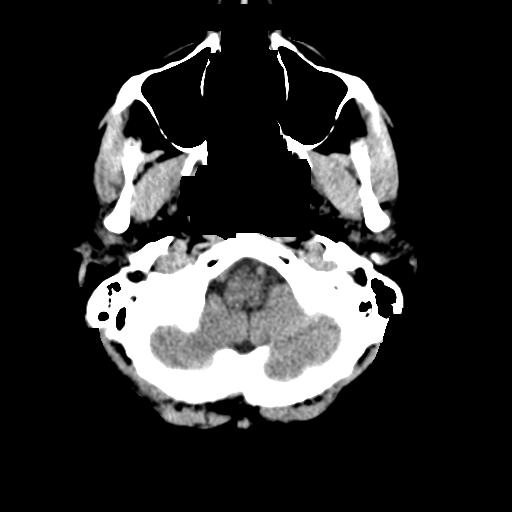
[im 4/31  bone]
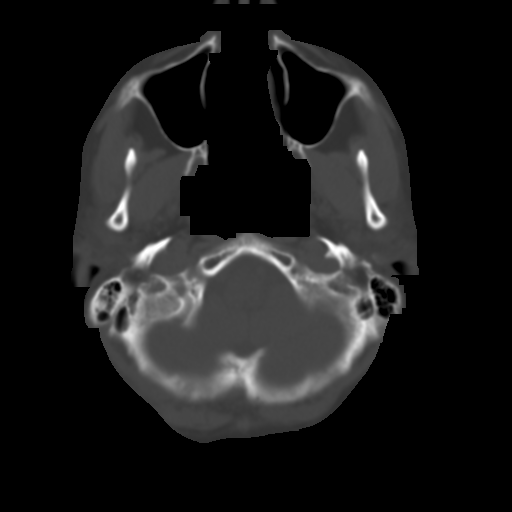
[im 8/31  brain]
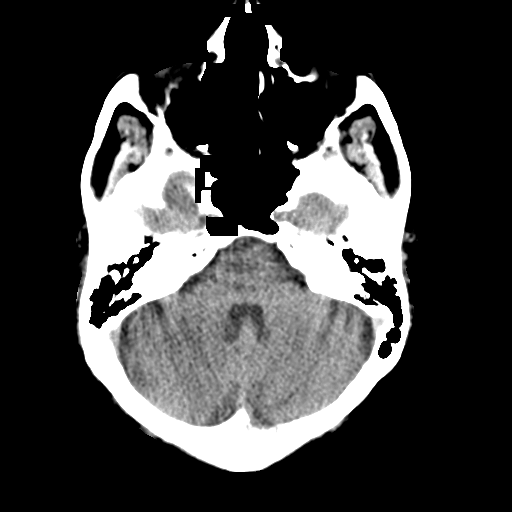
[im 12/31  brain]
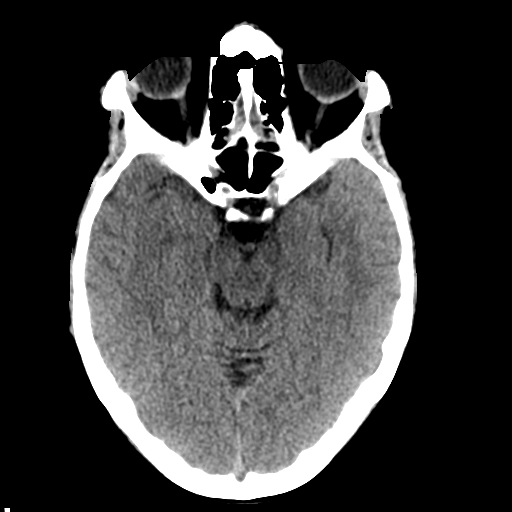
[im 16/31  brain]
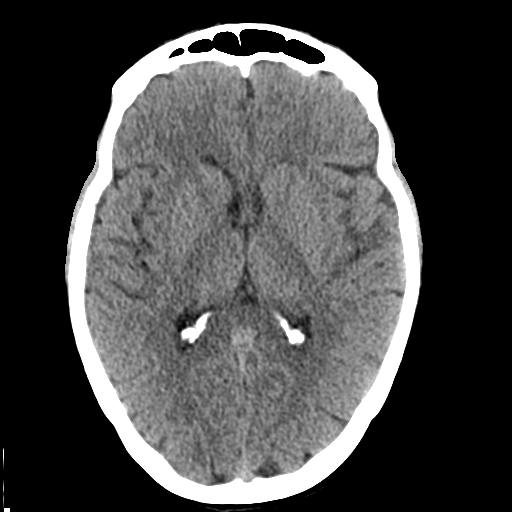
[im 19/31  brain]
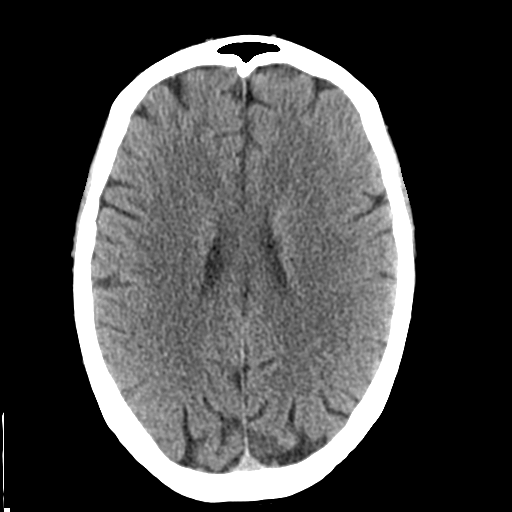
[im 19/31  bone]
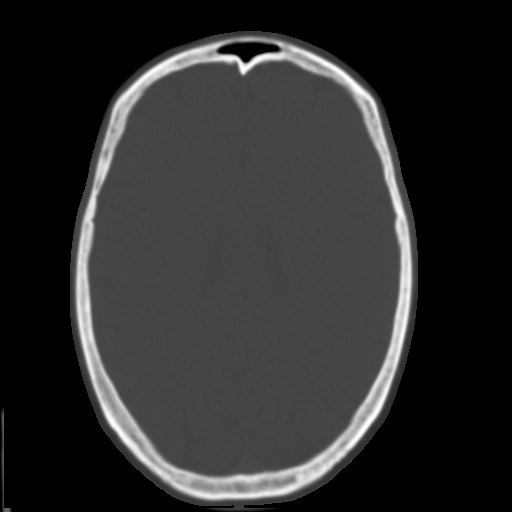
[im 23/31  brain]
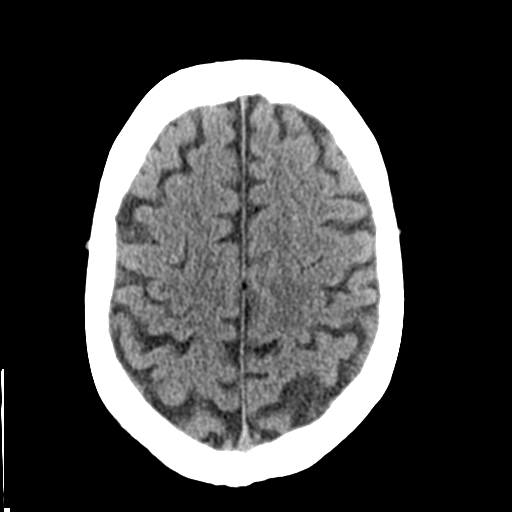
[im 27/31  brain]
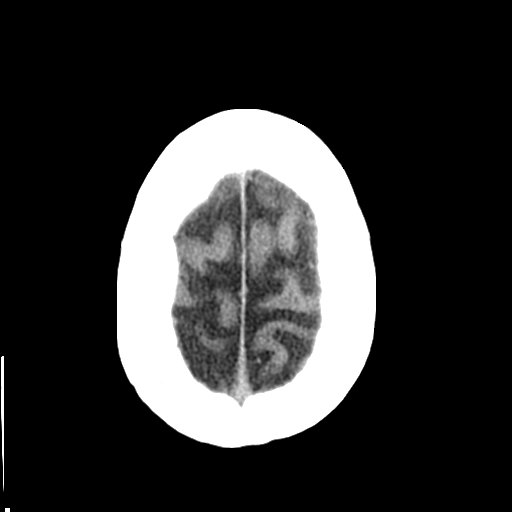

[Series 3: head bone · axial · 0.39mm/px · z∈[-90,-58]mm · 3 of 78 slices shown]
[im 8/78  bone]
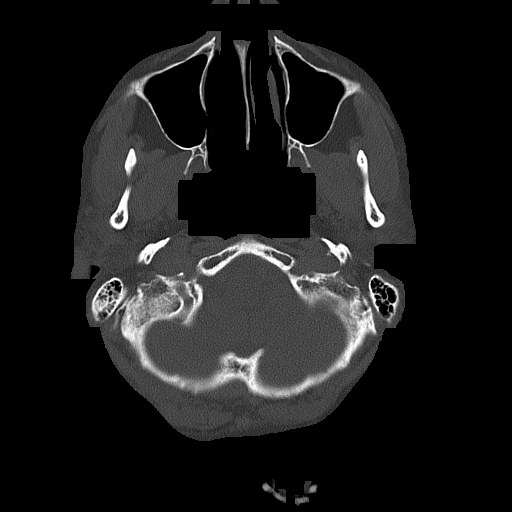
[im 16/78  bone]
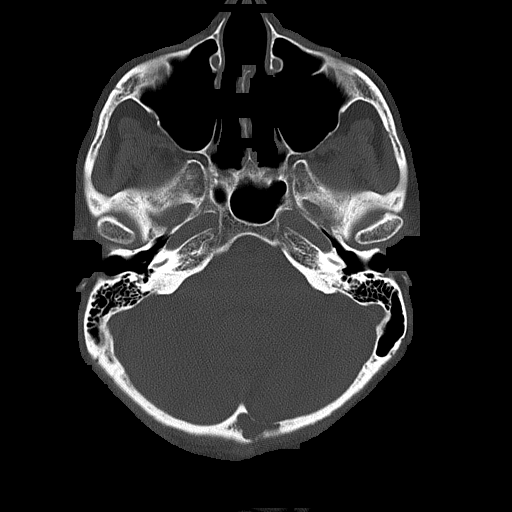
[im 24/78  bone]
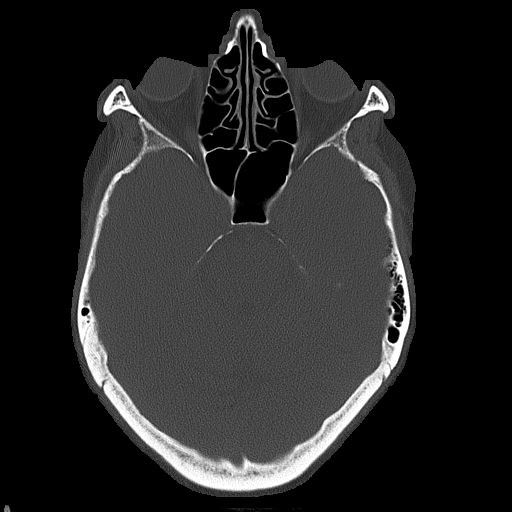

[Series 4: coronal soft tissue · coronal · 0.31mm/px · 3 of 68 slices shown]
[im 23/68  brain]
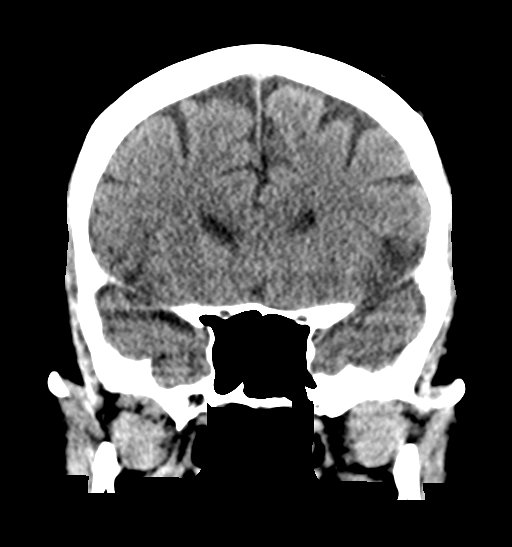
[im 30/68  brain]
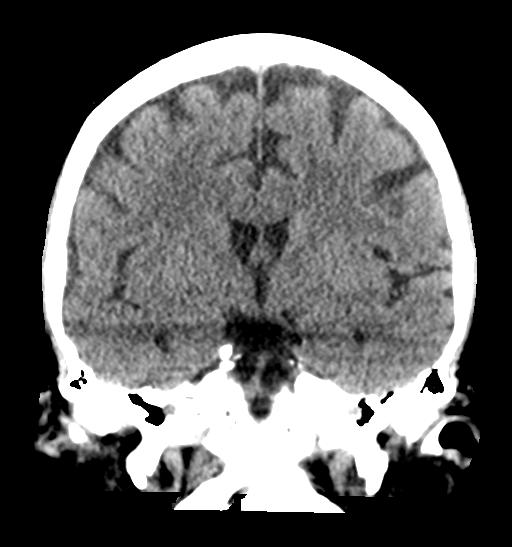
[im 38/68  brain]
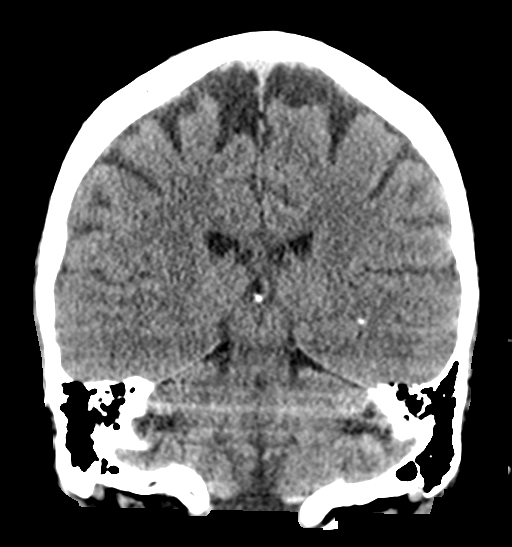

[Series 5: sagittal soft tissue · sagittal · 0.33mm/px · 3 of 54 slices shown]
[im 18/54  brain]
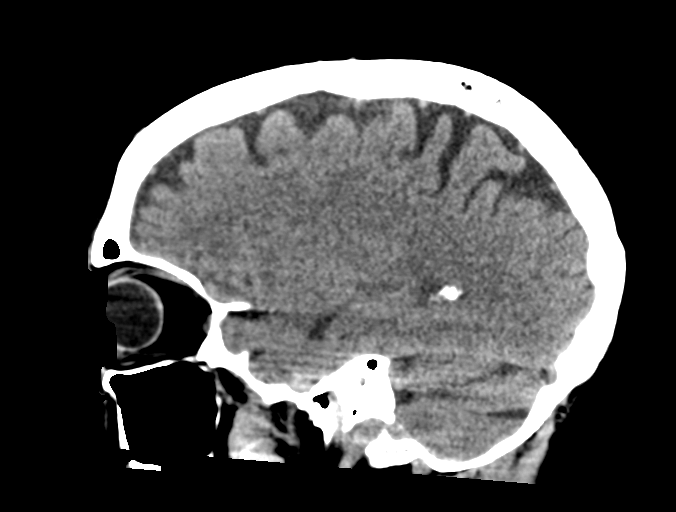
[im 27/54  brain]
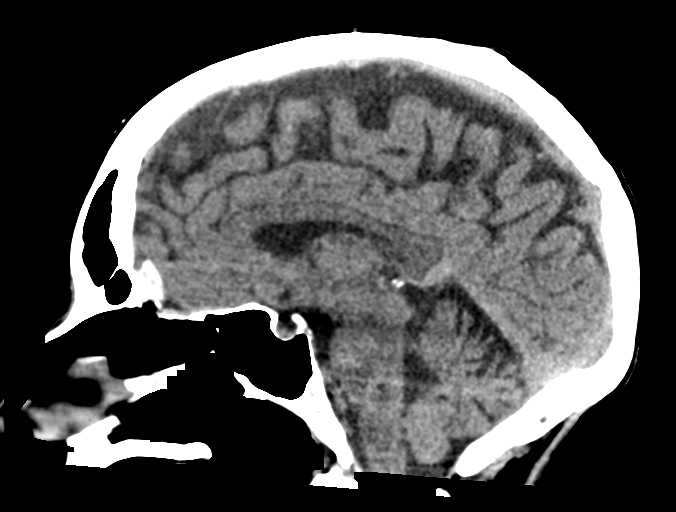
[im 36/54  brain]
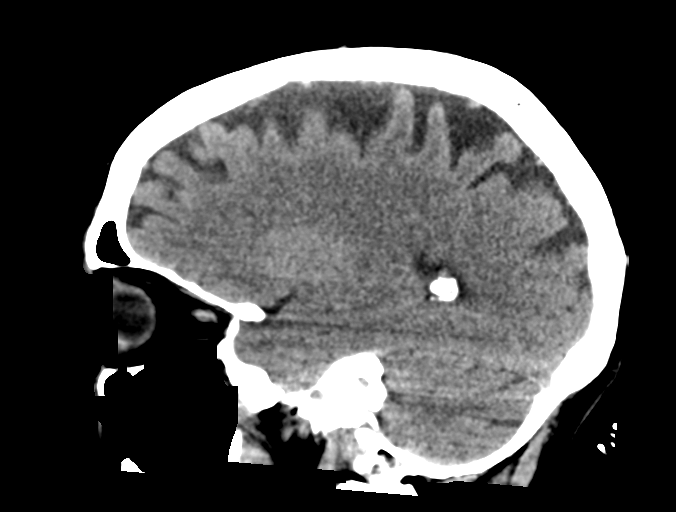

[16 of 47 positions shown; findings below may reference images not displayed]

FINDINGS: Brain: No acute intracranial findings are seen. There are no signs
of bleeding within the cranium. Cortical sulci are prominent.
Ventricles are not dilated. There is no shift of midline structures.

Vascular: Unremarkable.

Skull: No fracture is seen in the calvarium.

Sinuses/Orbits: There is mucosal thickening in the right maxillary
sinus.

Other: None.
IMPRESSION: No acute intracranial findings are seen.  Atrophy.
